# Patient Record
Sex: Male | Born: 1945 | Race: White | Hispanic: No | Marital: Married | Smoking: Never smoker
Health system: Southern US, Community
[De-identification: ages and names within clinical notes are randomized; demographics above are authoritative.]

## PROBLEM LIST (undated history)

## (undated) DIAGNOSIS — I499 Cardiac arrhythmia, unspecified: Secondary | ICD-10-CM

## (undated) DIAGNOSIS — D696 Thrombocytopenia, unspecified: Secondary | ICD-10-CM

## (undated) DIAGNOSIS — L729 Follicular cyst of the skin and subcutaneous tissue, unspecified: Secondary | ICD-10-CM

## (undated) DIAGNOSIS — I10 Essential (primary) hypertension: Secondary | ICD-10-CM

## (undated) DIAGNOSIS — M109 Gout, unspecified: Secondary | ICD-10-CM

## (undated) DIAGNOSIS — K8020 Calculus of gallbladder without cholecystitis without obstruction: Secondary | ICD-10-CM

## (undated) DIAGNOSIS — N400 Enlarged prostate without lower urinary tract symptoms: Secondary | ICD-10-CM

## (undated) DIAGNOSIS — I714 Abdominal aortic aneurysm, without rupture, unspecified: Secondary | ICD-10-CM

## (undated) DIAGNOSIS — Z8711 Personal history of peptic ulcer disease: Secondary | ICD-10-CM

## (undated) DIAGNOSIS — N281 Cyst of kidney, acquired: Secondary | ICD-10-CM

## (undated) DIAGNOSIS — Z9289 Personal history of other medical treatment: Secondary | ICD-10-CM

## (undated) DIAGNOSIS — C801 Malignant (primary) neoplasm, unspecified: Secondary | ICD-10-CM

## (undated) DIAGNOSIS — Z8614 Personal history of Methicillin resistant Staphylococcus aureus infection: Secondary | ICD-10-CM

## (undated) DIAGNOSIS — I429 Cardiomyopathy, unspecified: Secondary | ICD-10-CM

## (undated) DIAGNOSIS — N4340 Spermatocele of epididymis, unspecified: Secondary | ICD-10-CM

## (undated) DIAGNOSIS — F1010 Alcohol abuse, uncomplicated: Secondary | ICD-10-CM

## (undated) DIAGNOSIS — K5790 Diverticulosis of intestine, part unspecified, without perforation or abscess without bleeding: Secondary | ICD-10-CM

## (undated) DIAGNOSIS — Z950 Presence of cardiac pacemaker: Secondary | ICD-10-CM

## (undated) DIAGNOSIS — F329 Major depressive disorder, single episode, unspecified: Secondary | ICD-10-CM

## (undated) DIAGNOSIS — K760 Fatty (change of) liver, not elsewhere classified: Secondary | ICD-10-CM

## (undated) DIAGNOSIS — E663 Overweight: Secondary | ICD-10-CM

## (undated) DIAGNOSIS — I6529 Occlusion and stenosis of unspecified carotid artery: Secondary | ICD-10-CM

## (undated) DIAGNOSIS — K279 Peptic ulcer, site unspecified, unspecified as acute or chronic, without hemorrhage or perforation: Secondary | ICD-10-CM

## (undated) DIAGNOSIS — Z95810 Presence of automatic (implantable) cardiac defibrillator: Secondary | ICD-10-CM

## (undated) DIAGNOSIS — C4490 Unspecified malignant neoplasm of skin, unspecified: Secondary | ICD-10-CM

## (undated) DIAGNOSIS — M5430 Sciatica, unspecified side: Secondary | ICD-10-CM

## (undated) DIAGNOSIS — F32A Depression, unspecified: Secondary | ICD-10-CM

## (undated) DIAGNOSIS — K269 Duodenal ulcer, unspecified as acute or chronic, without hemorrhage or perforation: Secondary | ICD-10-CM

## (undated) DIAGNOSIS — R339 Retention of urine, unspecified: Secondary | ICD-10-CM

## (undated) DIAGNOSIS — K219 Gastro-esophageal reflux disease without esophagitis: Secondary | ICD-10-CM

## (undated) DIAGNOSIS — D494 Neoplasm of unspecified behavior of bladder: Secondary | ICD-10-CM

## (undated) DIAGNOSIS — I509 Heart failure, unspecified: Secondary | ICD-10-CM

## (undated) DIAGNOSIS — E785 Hyperlipidemia, unspecified: Secondary | ICD-10-CM

## (undated) DIAGNOSIS — N5089 Other specified disorders of the male genital organs: Secondary | ICD-10-CM

## (undated) DIAGNOSIS — E669 Obesity, unspecified: Secondary | ICD-10-CM

## (undated) DIAGNOSIS — G4730 Sleep apnea, unspecified: Secondary | ICD-10-CM

## (undated) DIAGNOSIS — I517 Cardiomegaly: Secondary | ICD-10-CM

## (undated) HISTORY — PX: HERNIA REPAIR: SHX51

## (undated) HISTORY — DX: Other specified disorders of the male genital organs: N50.89

## (undated) HISTORY — DX: Retention of urine, unspecified: R33.9

## (undated) HISTORY — PX: CATARACT EXTRACTION, BILATERAL: SHX1313

## (undated) HISTORY — PX: OTHER SURGICAL HISTORY: SHX169

## (undated) HISTORY — DX: Follicular cyst of the skin and subcutaneous tissue, unspecified: L72.9

## (undated) HISTORY — PX: INSERT / REPLACE / REMOVE PACEMAKER: SUR710

## (undated) HISTORY — DX: Diverticulosis of intestine, part unspecified, without perforation or abscess without bleeding: K57.90

## (undated) HISTORY — DX: Overweight: E66.3

## (undated) HISTORY — PX: FOOT NEUROMA SURGERY: SHX646

## (undated) HISTORY — DX: Personal history of peptic ulcer disease: Z87.11

## (undated) HISTORY — PX: VAGOTOMY: SUR1431

## (undated) HISTORY — DX: Spermatocele of epididymis, unspecified: N43.40

## (undated) HISTORY — PX: CARDIAC CATHETERIZATION: SHX172

## (undated) HISTORY — DX: Personal history of other medical treatment: Z92.89

## (undated) HISTORY — PX: EP IMPLANTABLE DEVICE: SHX172B

## (undated) HISTORY — DX: Neoplasm of unspecified behavior of bladder: D49.4

---

## 2004-07-03 ENCOUNTER — Ambulatory Visit: Payer: Self-pay | Admitting: Podiatry

## 2004-07-18 ENCOUNTER — Ambulatory Visit: Payer: Self-pay | Admitting: Podiatry

## 2004-07-24 ENCOUNTER — Inpatient Hospital Stay: Payer: Self-pay | Admitting: Podiatry

## 2004-12-03 ENCOUNTER — Emergency Department: Payer: Self-pay | Admitting: Emergency Medicine

## 2005-03-03 ENCOUNTER — Ambulatory Visit: Payer: Self-pay | Admitting: Gastroenterology

## 2005-10-29 ENCOUNTER — Ambulatory Visit: Payer: Self-pay | Admitting: Gastroenterology

## 2007-06-30 ENCOUNTER — Ambulatory Visit: Payer: Self-pay | Admitting: Internal Medicine

## 2008-05-12 ENCOUNTER — Inpatient Hospital Stay: Payer: Self-pay | Admitting: Surgery

## 2008-08-13 ENCOUNTER — Ambulatory Visit: Payer: Self-pay | Admitting: Unknown Physician Specialty

## 2009-01-16 ENCOUNTER — Ambulatory Visit: Payer: Self-pay | Admitting: Cardiology

## 2009-04-24 ENCOUNTER — Ambulatory Visit: Payer: Self-pay | Admitting: Orthopedic Surgery

## 2009-04-29 ENCOUNTER — Ambulatory Visit: Payer: Self-pay | Admitting: Orthopedic Surgery

## 2009-07-28 ENCOUNTER — Emergency Department: Payer: Self-pay | Admitting: Unknown Physician Specialty

## 2009-08-07 ENCOUNTER — Ambulatory Visit: Payer: Self-pay | Admitting: Cardiology

## 2009-08-09 ENCOUNTER — Ambulatory Visit: Payer: Self-pay | Admitting: Cardiology

## 2010-05-04 DIAGNOSIS — C801 Malignant (primary) neoplasm, unspecified: Secondary | ICD-10-CM

## 2010-05-04 DIAGNOSIS — C20 Malignant neoplasm of rectum: Secondary | ICD-10-CM

## 2010-05-04 HISTORY — DX: Malignant (primary) neoplasm, unspecified: C80.1

## 2010-05-04 HISTORY — DX: Malignant neoplasm of rectum: C20

## 2010-06-23 ENCOUNTER — Ambulatory Visit: Payer: Self-pay | Admitting: Unknown Physician Specialty

## 2010-07-01 ENCOUNTER — Ambulatory Visit: Payer: Self-pay | Admitting: Unknown Physician Specialty

## 2010-07-02 LAB — PATHOLOGY REPORT

## 2011-05-05 DIAGNOSIS — C4490 Unspecified malignant neoplasm of skin, unspecified: Secondary | ICD-10-CM

## 2011-05-05 HISTORY — DX: Unspecified malignant neoplasm of skin, unspecified: C44.90

## 2011-10-19 ENCOUNTER — Ambulatory Visit: Payer: Self-pay | Admitting: Unknown Physician Specialty

## 2011-10-21 LAB — PATHOLOGY REPORT

## 2012-03-09 ENCOUNTER — Ambulatory Visit: Payer: Self-pay | Admitting: Internal Medicine

## 2012-05-04 DIAGNOSIS — R55 Syncope and collapse: Secondary | ICD-10-CM

## 2012-05-04 HISTORY — DX: Syncope and collapse: R55

## 2012-05-04 HISTORY — PX: EYE SURGERY: SHX253

## 2012-06-28 ENCOUNTER — Ambulatory Visit: Payer: Self-pay | Admitting: Sports Medicine

## 2012-08-26 ENCOUNTER — Ambulatory Visit: Payer: Self-pay | Admitting: Physical Medicine and Rehabilitation

## 2012-08-26 LAB — APTT: Activated PTT: 28.9 secs (ref 23.6–35.9)

## 2012-08-26 LAB — PLATELET COUNT: Platelet: 126 10*3/uL — ABNORMAL LOW (ref 150–440)

## 2012-12-30 ENCOUNTER — Emergency Department: Payer: Self-pay | Admitting: Emergency Medicine

## 2012-12-30 LAB — COMPREHENSIVE METABOLIC PANEL
Albumin: 3.6 g/dL (ref 3.4–5.0)
BUN: 13 mg/dL (ref 7–18)
Calcium, Total: 8.9 mg/dL (ref 8.5–10.1)
Co2: 27 mmol/L (ref 21–32)
EGFR (African American): >60
EGFR (Non-African Amer.): >60
Potassium: 3.2 mmol/L — ABNORMAL LOW (ref 3.5–5.1)
SGOT(AST): 36 U/L (ref 15–37)
SGPT (ALT): 41 U/L (ref 12–78)
Sodium: 138 mmol/L (ref 136–145)

## 2012-12-30 LAB — CBC
HCT: 48.7 % (ref 40.0–52.0)
HGB: 16.9 g/dL (ref 13.0–18.0)
MCH: 30 pg (ref 26.0–34.0)
MCHC: 34.6 g/dL (ref 32.0–36.0)
MCV: 87 fL (ref 80–100)
RDW: 13.2 % (ref 11.5–14.5)
WBC: 8.2 10*3/uL (ref 3.8–10.6)

## 2012-12-30 LAB — CK TOTAL AND CKMB (NOT AT ARMC): CK, Total: 205 U/L (ref 35–232)

## 2013-03-04 ENCOUNTER — Observation Stay: Payer: Self-pay | Admitting: Internal Medicine

## 2013-03-04 LAB — PROTIME-INR: INR: 1

## 2013-03-04 LAB — CK TOTAL AND CKMB (NOT AT ARMC)
CK, Total: 65 U/L (ref 35–232)
CK-MB: 1.1 ng/mL (ref 0.5–3.6)

## 2013-03-04 LAB — COMPREHENSIVE METABOLIC PANEL
Albumin: 3.5 g/dL (ref 3.4–5.0)
Anion Gap: 9 (ref 7–16)
Calcium, Total: 9 mg/dL (ref 8.5–10.1)
Co2: 24 mmol/L (ref 21–32)
Creatinine: 0.84 mg/dL (ref 0.60–1.30)
EGFR (African American): >60
EGFR (Non-African Amer.): >60
Glucose: 152 mg/dL — ABNORMAL HIGH (ref 65–99)
Osmolality: 277 (ref 275–301)
Potassium: 3 mmol/L — ABNORMAL LOW (ref 3.5–5.1)
SGOT(AST): 31 U/L (ref 15–37)
Sodium: 138 mmol/L (ref 136–145)
Total Protein: 7.2 g/dL (ref 6.4–8.2)

## 2013-03-04 LAB — CBC
HCT: 48 % (ref 40.0–52.0)
MCV: 86 fL (ref 80–100)
Platelet: 124 10*3/uL — ABNORMAL LOW (ref 150–440)
RDW: 14.4 % (ref 11.5–14.5)

## 2013-03-04 LAB — LIPASE, BLOOD: Lipase: 86 U/L (ref 73–393)

## 2013-03-04 LAB — APTT: Activated PTT: 29 secs (ref 23.6–35.9)

## 2013-03-04 LAB — TROPONIN I: Troponin-I: <0.02 ng/mL

## 2013-03-11 ENCOUNTER — Emergency Department: Payer: Self-pay | Admitting: Emergency Medicine

## 2013-03-11 LAB — URINALYSIS, COMPLETE
Bacteria: NONE SEEN
Glucose,UR: NEGATIVE mg/dL (ref 0–75)
Ketone: NEGATIVE
Leukocyte Esterase: NEGATIVE
Nitrite: NEGATIVE
Ph: 6 (ref 4.5–8.0)
Protein: NEGATIVE
RBC,UR: NONE SEEN /HPF (ref 0–5)
Specific Gravity: 1.002 (ref 1.003–1.030)
Squamous Epithelial: <1
WBC UR: <1 /HPF (ref 0–5)

## 2013-07-07 ENCOUNTER — Ambulatory Visit: Payer: Self-pay | Admitting: Urology

## 2013-07-12 LAB — PATHOLOGY REPORT

## 2014-01-17 ENCOUNTER — Observation Stay: Payer: Self-pay | Admitting: Internal Medicine

## 2014-01-17 LAB — URINALYSIS, COMPLETE
BACTERIA: NONE SEEN
BILIRUBIN, UR: NEGATIVE
Blood: NEGATIVE
GLUCOSE, UR: NEGATIVE mg/dL (ref 0–75)
Hyaline Cast: 1
KETONE: NEGATIVE
Leukocyte Esterase: NEGATIVE
NITRITE: NEGATIVE
PH: 5 (ref 4.5–8.0)
PROTEIN: NEGATIVE
RBC,UR: <1 /HPF (ref 0–5)
Specific Gravity: 1.012 (ref 1.003–1.030)
Squamous Epithelial: 1

## 2014-01-17 LAB — CBC WITH DIFFERENTIAL/PLATELET
BASOS ABS: 0.1 10*3/uL (ref 0.0–0.1)
Basophil %: 1.1 %
Eosinophil #: 0.4 10*3/uL (ref 0.0–0.7)
Eosinophil %: 5.3 %
HCT: 46.1 % (ref 40.0–52.0)
HGB: 15.3 g/dL (ref 13.0–18.0)
LYMPHS ABS: 2 10*3/uL (ref 1.0–3.6)
Lymphocyte %: 26.7 %
MCH: 30.1 pg (ref 26.0–34.0)
MCHC: 33.1 g/dL (ref 32.0–36.0)
MCV: 91 fL (ref 80–100)
Monocyte #: 0.7 x10 3/mm (ref 0.2–1.0)
Monocyte %: 9.4 %
NEUTROS ABS: 4.2 10*3/uL (ref 1.4–6.5)
Neutrophil %: 57.5 %
Platelet: 115 10*3/uL — ABNORMAL LOW (ref 150–440)
RBC: 5.08 10*6/uL (ref 4.40–5.90)
RDW: 13.4 % (ref 11.5–14.5)
WBC: 7.4 10*3/uL (ref 3.8–10.6)

## 2014-01-17 LAB — COMPREHENSIVE METABOLIC PANEL
ALBUMIN: 3.3 g/dL — AB (ref 3.4–5.0)
ALT: 36 U/L
ANION GAP: 10 (ref 7–16)
AST: 23 U/L (ref 15–37)
Alkaline Phosphatase: 63 U/L
BILIRUBIN TOTAL: 0.5 mg/dL (ref 0.2–1.0)
BUN: 11 mg/dL (ref 7–18)
CREATININE: 0.94 mg/dL (ref 0.60–1.30)
Calcium, Total: 9.1 mg/dL (ref 8.5–10.1)
Chloride: 104 mmol/L (ref 98–107)
Co2: 25 mmol/L (ref 21–32)
GLUCOSE: 124 mg/dL — AB (ref 65–99)
Osmolality: 278 (ref 275–301)
POTASSIUM: 3.2 mmol/L — AB (ref 3.5–5.1)
SODIUM: 139 mmol/L (ref 136–145)
Total Protein: 6.7 g/dL (ref 6.4–8.2)

## 2014-01-22 DIAGNOSIS — M5116 Intervertebral disc disorders with radiculopathy, lumbar region: Secondary | ICD-10-CM | POA: Insufficient documentation

## 2014-01-24 DIAGNOSIS — M5136 Other intervertebral disc degeneration, lumbar region: Secondary | ICD-10-CM | POA: Insufficient documentation

## 2014-01-24 DIAGNOSIS — M51369 Other intervertebral disc degeneration, lumbar region without mention of lumbar back pain or lower extremity pain: Secondary | ICD-10-CM | POA: Insufficient documentation

## 2014-02-20 DIAGNOSIS — N400 Enlarged prostate without lower urinary tract symptoms: Secondary | ICD-10-CM | POA: Insufficient documentation

## 2014-02-22 ENCOUNTER — Ambulatory Visit: Payer: Self-pay

## 2014-02-28 DIAGNOSIS — Z95810 Presence of automatic (implantable) cardiac defibrillator: Secondary | ICD-10-CM | POA: Insufficient documentation

## 2014-02-28 DIAGNOSIS — Z9889 Other specified postprocedural states: Secondary | ICD-10-CM | POA: Insufficient documentation

## 2014-02-28 DIAGNOSIS — E785 Hyperlipidemia, unspecified: Secondary | ICD-10-CM | POA: Insufficient documentation

## 2014-02-28 DIAGNOSIS — G4730 Sleep apnea, unspecified: Secondary | ICD-10-CM | POA: Insufficient documentation

## 2014-02-28 DIAGNOSIS — I429 Cardiomyopathy, unspecified: Secondary | ICD-10-CM | POA: Insufficient documentation

## 2014-06-01 ENCOUNTER — Emergency Department: Payer: Self-pay | Admitting: Emergency Medicine

## 2014-06-01 LAB — BASIC METABOLIC PANEL
Anion Gap: 9 (ref 7–16)
BUN: 7 mg/dL (ref 7–18)
CALCIUM: 9 mg/dL (ref 8.5–10.1)
CHLORIDE: 105 mmol/L (ref 98–107)
Co2: 27 mmol/L (ref 21–32)
Creatinine: 0.87 mg/dL (ref 0.60–1.30)
EGFR (African American): >60
GLUCOSE: 111 mg/dL — AB (ref 65–99)
OSMOLALITY: 280 (ref 275–301)
POTASSIUM: 3.2 mmol/L — AB (ref 3.5–5.1)
SODIUM: 141 mmol/L (ref 136–145)

## 2014-06-01 LAB — URINALYSIS, COMPLETE
Bilirubin,UR: NEGATIVE
Glucose,UR: NEGATIVE mg/dL (ref 0–75)
Ketone: NEGATIVE
Leukocyte Esterase: NEGATIVE
Nitrite: NEGATIVE
Ph: 6 (ref 4.5–8.0)
Protein: NEGATIVE
RBC,UR: <1 /HPF (ref 0–5)
SPECIFIC GRAVITY: 1.003 (ref 1.003–1.030)
Squamous Epithelial: NONE SEEN
WBC UR: <1 /HPF (ref 0–5)

## 2014-06-03 LAB — URINE CULTURE

## 2014-06-23 ENCOUNTER — Emergency Department: Payer: Self-pay | Admitting: Emergency Medicine

## 2014-07-11 ENCOUNTER — Ambulatory Visit: Payer: Self-pay

## 2014-08-24 NOTE — Discharge Summary (Signed)
PATIENT NAME:  Martin Hardy, Martin Hardy MR#:  [id number] DATE OF BIRTH:  Jun 15, 1945  DATE OF ADMISSION:  03/04/2013 DATE OF DISCHARGE:  03/04/2013  REASON FOR ADMISSION: Chest pain.   HISTORY OF PRESENT ILLNESS: Please see the dictated history of present illness done by Dr. Margaretmary Eddy on 03/04/2013.   PAST MEDICAL HISTORY: 1.  History of atrial fibrillation.  2.  Dilated cardiomyopathy, status post defibrillator placement.  3.  Obstructive sleep apnea, on CPAP.  4.  History of peptic ulcer disease.  5.  History of alcohol abuse.  6.  Benign hypertension.  7.  Hyperlipidemia.  8.  Status post hernia repair surgery.   MEDICATIONS ON ADMISSION:  Please see admission note.   ALLERGIES: None.   SOCIAL HISTORY:  As per admission note.  FAMILY HISTORY:  As per admission note.    REVIEW OF SYSTEMS: As per admission note.   PHYSICAL EXAM: GENERAL: The patient was in no acute distress.  VITAL SIGNS: Stable and he was afebrile.  HEENT: Exam was unremarkable.  NECK: Supple without JVD.  LUNGS: Clear.  CARDIAC: Exam revealed a regular rate and rhythm. Normal S1, S2.  ABDOMEN: Soft and nontender.  EXTREMITIES: Without edema.  NEUROLOGIC EXAM: Grossly nonfocal.   HOSPITAL COURSE: The patient was admitted with chest pain for rule out MI. He was admitted to telemetry. His pain resolved. He had 2 sets of normal cardiac enzymes. He was seen in consultation by Dr. Clayborn Bigness of cardiology, who felt that the patient was safe for discharge for outpatient followup with Dr. Saralyn Pilar. The patient was insistent upon discharge within 24 hours as his father had recently died and he needs to make funeral arrangements. Again, the cardiologist felt that it was safe for him to leave after 2 sets of negative enzymes. He was pain-free at the time of discharge and was discharged home on 03/04/2013 in stable condition.   DISCHARGE DIAGNOSES: 1.  Angina.  2.  Atherosclerotic cardiovascular disease.  3.  History of atrial  fibrillation.  4.  Cardiomyopathy, status post defibrillator placement.  5.  Benign hypertension.  6.  Hyperlipidemia.   DISCHARGE MEDICATIONS: 1.  Effexor-XR 75 mg p.o. daily.  2.  Lyrica as before.  3.  Lisinopril 40 mg p.o. daily.  4.  Flomax 0.4 mg p.o. daily.  5.  Nitrostat 0.4 mg sublingually p.r.n. chest pain.  6.  Zofran 4 mg p.o. every 6 hours p.r.n. nausea and vomiting.  7.  Zocor 20 mg p.o. at bedtime.  8.  Aspirin 325 mg p.o. daily.  9.  Coreg 25 mg p.o. b.i.d.  10.  Hydrochlorothiazide 25 mg p.o. daily.  11.  Mag-Ox 400 mg p.o. daily.  12.  Omeprazole 20 mg p.o. daily.   FOLLOWUP PLANS AND APPOINTMENTS: The patient was discharged on a low-sodium, low-fat, low-cholesterol diet. He will follow up with Dr. Saralyn Pilar within 1 week's time, sooner if needed.   ____________________________ Leonie Douglas Doy Hutching, MD jds:cs D: 03/05/2013 10:52:43 ET T: 03/05/2013 18:32:18 ET JOB#: [id number]  cc: Leonie Douglas. Doy Hutching, MD, <Dictator> JEFFREY Lennice Sites MD ELECTRONICALLY SIGNED 03/06/2013 7:32

## 2014-08-24 NOTE — H&P (Signed)
PATIENT NAME:  Martin Hardy, Martin Hardy MR#:  [id number] DATE OF BIRTH:  [birth year]  DATE OF ADMISSION:  03/04/2013  PRIMARY CARE PHYSICIAN:  Dr. Ola Spurr.   REFERRING PHYSICIAN:  Dr. Jacqualine Code  CHIEF COMPLAINT: Chest pain.   HISTORY OF PRESENT ILLNESS: The patient is a 69 year old male with past medical history of hypertension, hyperlipidemia, peptic ulcer disease, history of alcohol abuse, obstructive sleep apnea, lives on CPAP at bedtime and a history of paroxysmal atrial fibrillation, status post AICD, who is presenting to the ER with a chief complaint of midsternal chest pressure. The patient is reporting that he slept and in the middle of the night he woke up with chest pain. Denies any shortness of breath or radiation of the chest pain.  No nausea or vomiting. As the patient could not fall asleep with the chest pressure, he was brought into the ER by his wife. In the ER, the patient was given aspirin and sublingual nitroglycerin following which his pain eased off. He is describing that this is with no radiation. Denies any shortness of breath, diaphoresis, nausea, vomiting. No similar complaints in the past. The patient was seen by Dr. Saralyn Pilar a few years ago and had stress test done which was normal as reported by the patient. The patient follows up with Dr. Saralyn Pilar as an outpatient regarding his atrial fibrillation. The patient is stressed out and feeling sad as his dad just passed away last 2022/08/30. The patient's EKG has revealed chronic T wave inversions in leads V3 to V6. During my examination, the patient's chest pain is 2 out of 10 which trended down from 7 out of 10. The patient is resting comfortably and wife is at bedside.   PAST MEDICAL HISTORY: History of atrial fibrillation, status post AICD, hypertension, hyperlipidemia, obstructive sleep apnea uses CPAP at bedtime, peptic ulcer disease, alcohol abuse.   PAST SURGICAL HISTORY: The patient has had stress test, which was normal. Hernia  repair surgery. AICD placement.   ALLERGIES: None  PSYCHOSOCIAL HISTORY: Lives at home with wife. Denies any history of smoking, alcohol, or illicit drug usage.   FAMILY HISTORY: Dad had history of diabetes mellitus. Mother had history of COPD.   MEDICATIONS:  Prilosec 20 mg once daily, lisinopril 40 mg once daily,  Flomax 0.4 mg once a day, Coreg 12.5 mg 2 tablets p.o. 2 times a day.   REVIEW OF SYSTEMS: CONSTITUTIONAL: Denies any fever or fatigue.  EYES: Denies blurry vision or double vision.  EARS, NOSE, THROAT: No epistaxis or discharge.  RESPIRATORY: Denies cough, COPD. Has obstructive sleep apnea. CARDIOVASCULAR: Complaining of midsternal chest pressure which is significantly improved with aspirin and nitroglycerin.  GASTROINTESTINAL: Denies nausea, vomiting, diarrhea, abdominal pain.  GENITOURINARY: No dysuria or hematuria. Denies any hernias.  ENDOCRINE: Denies polyuria, nocturia, thyroid problems. HEMATOLOGY:  Denies anemia, easy bruising or bleeding.  INTEGUMENTARY: No acnes, rash or lesions.  MUSCULOSKELETAL:  No joint pain in the neck or swelling.   NEUROLOGIC:  Denies any history of vertigo, ataxia or stroke in the past.  PSYCHIATRIC: No ADD, OCD.   PHYSICAL EXAMINATION: VITAL SIGNS: Temperature 98 degrees Fahrenheit, pulse 50, respirations 18, blood pressure 95/53, pulse ox 93% on 2 liters.  GENERAL APPEARANCE: Not in acute distress. Moderately built and nourished.  HEENT: Normocephalic, atraumatic. Pupils are equally reacting to light and accommodation. No scleral icterus. No conjunctival injection. No sinus tenderness. No postnasal drip. No angioedema.  NECK: Supple. No JVD. No thyromegaly. No lymphadenopathy. Range of motion is intact.  LUNGS: Clear to auscultation bilaterally. No accessory muscle usage.  Anterior chest wall tenderness.  CARDIAC: S1, S2 normal. Regular rate and rhythm. No murmurs.  GASTROINTESTINAL: Soft. Bowel sounds are positive in all 4 quadrants.  Nontender, nondistended. No hepatosplenomegaly. No masses felt.  NEUROLOGIC:  Awake and oriented x 3. Motor and sensory are grossly intact. Reflexes are 2+.  EXTREMITIES: No edema. No cyanosis. No clubbing.  PSYCHIATRIC: Normal mood and affect.  MUSCULOSKELETAL: No joint effusion, tenderness, erythema.  VASCULAR: Peripheral pulses are 2+.  LABORATORY AND IMAGING STUDIES: Chest x-ray, no acute findings. EKG: Normal sinus rhythm. Incomplete right bundle branch block.  Current T wave inversions in leads V3 through V6. Cardiac enzymes are negative. CBC normal except platelet count is at 124. PT and INR are normal. LFTs are normal. BMP: Potassium is 3.0, glucose 152. The rest of the labs are normal.   ASSESSMENT AND PLAN: A 69 year old Caucasian male brought into the ER with midsternal chest pressure will be admitted with the following assessment and plan.  1.  Chest pain, rule out acute coronary syndrome, probably from stress from bereavement reaction.  Will admit him to telemetry. Cycle cardiac biomarkers. We will implement acute coronary syndrome protocol with oxygen, nitroglycerin, aspirin, beta blocker and statin. Will monitor him on telemetry.  2.  Sinus bradycardia. The patient is symptomatic. Will monitor him closely and the Coreg dose is reduced to half and if necessary, we will hold the medication if the patient persistently has bradycardia.  3.  Obstructive sleep apnea. Continue CPAP at bedtime.  4.  Hypertension. Blood pressure is stable.  5.  Hyperlipidemia. Continue statin.  6.  Peptic ulcer disease. Continue proton pump inhibitor.  7.  History of atrial fibrillation status post automatic implantable cardioverter-defibrillator  currently not on any anticoagulation.  8.  We will provide him gastrointestinal prophylaxis and deep vein thrombosis prophylaxis.  He is FULL CODE. Wife is the medical power of attorney. Will transfer the patient to Dr. Ola Spurr from [facility] group in a.m.    Total time spent on admission is 45 minutes.  Diagnosis and plan of care was discussed in detail with the patient.    ____________________________ Nicholes Mango, MD ag:dp D: 03/04/2013 06:16:17 ET T: 03/04/2013 06:45:08 ET JOB#: [id number]  cc: Nicholes Mango, MD, <Dictator> Nicholes Mango MD ELECTRONICALLY SIGNED 03/19/2013 8:05

## 2014-08-24 NOTE — Consult Note (Signed)
PATIENT NAME:  Martin Hardy, DESTIN MR#:  [id number] DATE OF BIRTH:  1946/03/01  DATE OF CONSULTATION:  03/04/2013  REFERRING PHYSICIAN:  Nicholes Mango, MD CONSULTING PHYSICIAN:  Dwayne D. Clayborn Bigness, MD  A patient of Dr. Ola Spurr. The patient sees Dr. Saralyn Pilar.   INDICATION: Chest pain.   HISTORY OF PRESENT ILLNESS: The patient is a 69 year old white male with history of hypertension, hyperlipidemia, peptic ulcer disease, alcohol abuse, obstructive sleep apnea, CPAP at bedtime, history of paroxysmal atrial fibrillation, status post AICD for cardiomyopathy, who presented to the ER with midsternal chest pain. Reported that he slept and in the middle of the night woke up with chest pain. Denies any shortness of breath. No radiation of pain. No nausea or vomiting. Had trouble falling asleep. Came to the Emergency Room with his wife, given aspirin and sublingual nitroglycerin and got better. No radiation of the pain is mentioned. No similar complaints in the past. He saw Dr. Saralyn Pilar in the past and had a stress test done. He sees Dr. Saralyn Pilar as an outpatient for his atrial fibrillation. 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 EKG had nonspecific findings. He presented for evaluation with chest pain, 7 out of 10.   PAST MEDICAL HISTORY: Atrial fibrillation, AICD, cardiomyopathy, hypertension, hyperlipidemia, obstructive sleep apnea, peptic ulcer disease, alcohol abuse.   PAST SURGICAL HISTORY: Hernia repair, AICD placement.   ALLERGIES: He states none.    SOCIAL HISTORY: Lives with his wife. Denies smoking. Has had a history of alcohol abuse in the past.   FAMILY HISTORY: Diabetes, COPD.  MEDICATIONS:  1. Prilosec 20 daily.  2. Lisinopril 40 a day. 3. Flomax 0.4 a day.  4. Coreg 12.5 twice a day.  5. Aspirin 81 mg a day.   REVIEW OF SYSTEMS: Denies blackout spells or syncope. No nausea or vomiting. No fever, no chills, no sweats. No weight  loss, no weight gain. No hemoptysis, hematemesis or bright red blood per rectum. Complained of chest pain at rest without radiation. He has been under a lot of stress recently.   PHYSICAL EXAMINATION:  VITAL SIGNS: Blood pressure 100/60, pulse of 50, respiratory rate of 18, afebrile.  HEENT: Normocephalic, atraumatic. Pupils equal and reactive to light.  NECK: Supple. No significant JVD, bruits or adenopathy.  LUNGS: Clear to auscultation and percussion. No significant wheeze, rhonchi or rale.  HEART: Regular rate and rhythm, S3, soft S4. PMI displaced laterally. Systolic ejection murmur at the apex.  ABDOMEN: Benign.  EXTREMITIES: Within normal limits.  NEUROLOGIC: Intact.  SKIN: Normal.   LABORATORIES: Chest x-ray negative. EKG: Incomplete right bundle branch block, nonspecific ST-T wave changes. CBC normal, platelet count of 124. INR normal. LFTs normal. BMP was normal. Potassium was 3.0, glucose of 152.   ASSESSMENT: Chest pain, possible angina, bradycardia, obstructive sleep apnea, hypertension, hyperlipidemia, peptic ulcer disease, history of atrial fibrillation, hypokalemia, history of alcohol abuse, cardiomyopathy.   PLAN:  1. Agree with admit. Rule out for myocardial infarction. Will continue further evaluation. Continue to follow up EKGs and cardiac enzymes and telemetry. If the patient rules out, consider functional study versus trial of medical therapy.  2. For bradycardia, be sure that it is not symptomatic. Continue Coreg. May reduce the dose slightly to improve bradycardia. No evidence of high-grade block.  3. For obstructive sleep apnea, continue CPAP at night. 4. For blood pressure, continue blood pressure medications. Again, we may cut back on the Coreg because  of bradycardia.  5. For hyperlipidemia, continue statin.  6. For peptic ulcer disease, continue proton pump inhibitor. 7. Make sure the patient refrains from alcohol abuse. 8. Will follow up cardiomyopathy, and AICD  has not discharged and appears to be no significant arrhythmia.  9. Will continue to follow the patient. Treat the patient conservatively for now.   ____________________________ Loran Senters. Clayborn Bigness, MD ddc:lb D: 03/07/2013 11:19:38 ET T: 03/07/2013 11:38:24 ET JOB#: [id number]  cc: Dwayne D. Clayborn Bigness, MD, <Dictator> Yolonda Kida MD ELECTRONICALLY SIGNED 04/04/2013 21:16

## 2014-08-25 NOTE — Op Note (Signed)
PATIENT NAME:  Martin Hardy, Martin Hardy MR#:  [id number] DATE OF BIRTH:  27-May-1945  DATE OF PROCEDURE:  07/07/2013  PREOPERATIVE DIAGNOSIS:  Possible bladder tumor.   POSTOPERATIVE DIAGNOSIS:  Possible bladder tumor.  PROCEDURE: Cysto bladder tumor.   BIOPSY:  Just distal to the left ureteral orifice, with fulguration.   SURGEON: Rick Duff, DO  ANESTHESIA: General.   COMPLICATIONS: None.   BLOOD LOSS: Zero.   DESCRIPTION OF PROCEDURE: With the patient sterilely prepped and draped in supine lithotomy position for ease of approach to the external genitalia, I viewed the urethra and bladder. The prostatic urethra shows a good-sized middle lobe hypertrophy of the prostate, which is probably partially obstructed. There is trabeculation of the bladder. No sacculi diverticula are seen. There is a suspicious area just distal to the left ureteral orifice, which I biopsied with a Lowsley forceps, then fulgurate with a Bugbee electrode. Water was used for irrigation. Bladder was then emptied, 30 mL of 0.5% Marcaine placed in the bladder, B and O suppository in the rectum. Rectal exam reveals a prostate that is small rectally, but it is more internally enlarged in the middle lobe. The patient is sent to recovery in satisfactory condition. Will be discharged and seen in 2 weeks. Appropriate timeout occurred before the procedure began.    ____________________________ Janice Coffin. [name], DO rdh:mr D: 07/07/2013 14:26:59 ET T: 07/07/2013 22:05:20 ET JOB#: [id number]  cc: Janice Coffin. [name], DO, <Dictator> Freddi Forster D Clarivel Callaway DO ELECTRONICALLY SIGNED 07/20/2013 17:33

## 2014-08-25 NOTE — Discharge Summary (Signed)
PATIENT NAME:  Martin Hardy, Martin Hardy MR#:  [id number] DATE OF BIRTH:  12-Apr-1946  DATE OF ADMISSION:  01/17/2014 DATE OF DISCHARGE:  01/17/2014  ADMITTING DIAGNOSIS: Left leg weakness.   DISCHARGE DIAGNOSES: 1.  Left leg weakness, likely secondary to severe lumbar disk disease. His symptoms have now improved. No evidence of acute cerebrovascular accident based on his CT scan. His carotid Dopplers are without any significant stenosis.  2.  Chronic atrial fibrillation. The patient needs to be considered for anticoagulation. He reports that he has discussed this with his primary cardiologist who stated because of his gastrointestinal issues would not recommend aspirin either. I recommend that this needs to be re-evaluated and possible long-term anticoagulation needs to be considered.  3.  Hypokalemia, status post replacement.  4.  Gastroesophageal reflux disease.  5.  Lumbar spondylosis with moderate to severe left L4-L5 neural foraminal narrowing.  6.  Obstructive sleep apnea, on CPAP.  7.  Hypertension.  8.  Hyperlipidemia.   CONSULTANTS: None.   PERTINENT DIAGNOSTIC DATA: CT of the lumbar spine showed lumbar spondylosis with suspected moderate to severe left L4-L5 neural foraminal narrowing.   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   Ultrasound carotid Dopplers showed less than 50% stenosis in the right and the left internal carotid arteries. Irregular cardiac rhythm suggestive of A-fib.   Glucose 124, BUN 11, creatinine 0.94, sodium 139, potassium 3.2, chloride 104, CO2 25. Calcium 9.1. LFTs were normal, except albumin at 3.3. WBC 7.4, hemoglobin 15.3, platelet count 115,000.   HOSPITAL COURSE: Please refer to H and P done by the admitting physician. The patient is a 69 year old white male with history of chronic back problems, A-fib, not on anticoagulation, who has chronic issues with back pain who presented with left leg weakness. He presented with 1 to 2  day's duration of left leg weakness and associated with 1 day of the left leg paresthesia.  There was concern that maybe he had CVA-like symptoms. In the Emergency Room, therefore, he was admitted. His CT did show old caudate nucleus stroke, but no other abnormality was noted. The patient's symptoms rapidly improved. The patient does have a history of A-fib and it is recommended that he at least be placed on aspirin. The patient reported that he has been told by his primary cardiologist that with his GI issues he does not need aspirin. I recommend him to take aspirin and possible anticoagulation with a blood thinner for long-term prophylaxis for CVA and he is to discuss this further with his cardiologist. At this time, he is doing much better and is stable for discharge.   DISCHARGE MEDICATIONS: Lisinopril 40 daily, Flomax 0.4 at bedtime, carvedilol 25 mg 1 tab p.o. b.i.d., omeprazole 20 daily, Lyrica 150 mg 1 tab p.o. b.i.d. as needed, Percocet 5/325 q. 6 p.r.n., aspirin 325 p.o. daily.   DIET: Low-sodium.   ACTIVITY: As tolerated.   DISCHARGE FOLLOWUP: Follow up with Dr. Ola Spurr in 1 to 2 weeks. Follow with [facility] in 1 to 2 weeks. The patient also needs to follow up with his primary cardiologist to further discuss anticoagulation.  TIME SPENT: 45 minutes.   ____________________________ Lafonda Mosses Posey Pronto, MD shp:sb D: 01/18/2014 09:14:35 ET T: 01/18/2014 11:28:33 ET JOB#: [id number]  cc: Destani Wamser H. Posey Pronto, MD, <Dictator> Alric Seton MD ELECTRONICALLY SIGNED 01/19/2014 8:42

## 2014-08-25 NOTE — H&P (Signed)
PATIENT NAME:  Martin Hardy, FALLEN MR#:  [id number] DATE OF BIRTH:  06-Sep-1945  DATE OF ADMISSION:  01/17/2014  REFERRING PHYSICIAN: Dr.  Owens Shark.   PRIMARY CARE PHYSICIAN: Dr. Ola Spurr.   CHIEF COMPLAINT: Left leg weakness.   HISTORY OF PRESENT ILLNESS: A 69 year old Caucasian gentleman with history of atrial fibrillation not on anticoagulation, as well as chronic lumbago as well as sciatic pain involving the left leg presented with left leg weakness. He describes chronic back pain, which has been unchanged; however, now has had 1 to 2 day duration of the left leg weakness with associated 1 day of left leg paresthesias. Currently paresthesias is completely resolved. Emergency Department staff concerned for CVA-like symptoms, thus requesting admission. Currently complaining only of weakness as above, however, somewhat improved.   REVIEW OF SYSTEMS:  CONSTITUTIONAL: Denies fever, positive for weakness as described above.  EYES: No blurred vision, double vision. Denies any eye pain.   EARS, NOSE AND THROAT: Denies any tinnitus, ear pain, hearing loss.   RESPIRATORY: Denies cough, wheeze, shortness of breath.  CARDIOVASCULAR: Denies chest pain palpitations, edema.  GASTROINTESTINAL: Denies nausea, vomiting, diarrhea and abdominal pain.  GENITOURINARY: Denies dysuria, hematuria.  ENDOCRINE: Denies nocturia or thyroid problems.   HEMATOLOGIC/LYMPHATIC: Denies easy bruising or bleeding.   SKIN: Denies rash or lesion.  MUSCULOSKELETAL: Positive for chronic lumbar back pain. Denies neck, shoulders, knees, hip pain or any arthritic symptoms.   NEUROLOGIC: Full weakness and paresthesias of the left leg as described above. Denies any current numbness.  PSYCHIATRIC: Denies anxiety or depressive symptoms.   Otherwise full review of systems as reviewed by me is negative.      PAST MEDICAL HISTORY: Atrial fibrillation, hypertension, hyperlipidemia, obstructive sleep apnea requiring CPAP.   SOCIAL HISTORY:  Denies any tobacco use. Positive for occasional alcohol use.   FAMILY HISTORY: Positive for diabetes.  ALLERGIES:  NSAIDS.   HOME MEDICATIONS: 1. Percocet 5/325 mg 1 tablet p.o. q. 6 hours. 2. Lisinopril 40 mg p.o. q. daily. 3. Flomax 0.4 mg p.o. q. daily. 4.  Lyrica 150 mg p.o. b.i.d. 5. Coreg 25 mg p.o. b.i.d.  6. Prilosec 20 mg p.o. q. daily.   PHYSICAL EXAMINATION:  VITAL SIGNS: Temperature 98.2, heart rate 57, respirations 20, blood pressure 126/69, saturating 94% on room air. Weight 102.1 kg, BMI of 33.3.  GENERAL: Well-nourished, well-developed Caucasian male in no acute distress.  HEAD: Normocephalic, atraumatic.  EYES: Pupils equal, round reactive to light. Extraocular movements intact. No scleral icterus.   MOUTH: Moist mucosal membranes. Dentition is intact.  EARS, NOSE AND THROAT: Clear without lesions or exudates.   NECK: Supple. No thyromegaly. No nodules. No JVD.  PULMONARY: Clear to auscultation bilaterally without wheezes, rubs, or rhonchi. No accessory muscle use. Good respiratory effort.  CHEST: Nontender to palpation.  CARDIOVASCULAR: S1, S2, irregular rate and irregular rhythm. No murmurs, rubs, or gallops. No edema. Pedal pulses 2+ bilaterally.  GASTROINTESTINAL: Soft, nontender, nondistended. No masses. Positive bowel sounds. No hepatosplenomegaly.  MUSCULOSKELETAL: No swelling, clubbing, or edema. Range of motion full in all extremities.  NEUROLOGIC: Cranial nerves II through XII are intact. Strength bilaterally 5/5 in all extremities including proximal and distal flexions and extension, however, there is mild weakness on the left great toe extension consistent with L5 weakness.  Sensation intact. Pronator drift within normal limits.   SKIN: No ulceration, lesions, rash, cyanosis. Skin warm, dry. Turgor intact. PSYCHIATRIC: Mood and affect within normal limits. The patient is alert and oriented x 3 Insight and  judgment intact.   LABORATORY DATA: CT head  performed reveals no evidence for acute intracranial process. Remainder of laboratory data: Sodium 139, potassium 3.2, chloride 104, bicarbonate 25, BUN 11, creatinine 0.94, glucose 124, albumin 33.3. WBC 7.4, hemoglobin 15.3, platelets of 115.   ASSESSMENT AND PLAN: A 69 year old gentleman with history of atrial fibrillation, chronic lower back pain presenting with left leg weakness and paresthesias.  1. Left leg weakness. There is minimal L5, weakness on examination 4-/5. The ER staff concerned for cerebrovascular accident, however, I feel that this is most likely spinal in etiology and will get lumbar CT as unable to get MRI. Will also get physical therapy evaluation.  2. Hypokalemia. Replace to goal. 3. Gastroesophageal reflux disease. PPI therapy.  4. Atrial fibrillation, rate controlled. No anticoagulation.  5. Venous thromboembolism prophylaxis with heparin subcutaneous.   CODE STATUS: The patient is a full code.   TIME SPENT: 45 minutes    ____________________________ Aaron Mose. Hower, MD dkh:JT D: 01/17/2014 02:24:41 ET T: 01/17/2014 04:37:37 ET JOB#: [id number]  cc: Aaron Mose. Hower, MD, <Dictator> DAVID Woodfin Ganja MD ELECTRONICALLY SIGNED 01/24/2014 20:30

## 2014-09-13 ENCOUNTER — Encounter
Admission: RE | Admit: 2014-09-13 | Discharge: 2014-09-13 | Disposition: A | Discharge status: Home / Self Care | Payer: Medicare Other | Source: Ambulatory Visit | Attending: Urology | Admitting: Urology

## 2014-09-13 DIAGNOSIS — R301 Vesical tenesmus: Secondary | ICD-10-CM | POA: Diagnosis not present

## 2014-09-13 DIAGNOSIS — G4730 Sleep apnea, unspecified: Secondary | ICD-10-CM | POA: Insufficient documentation

## 2014-09-13 DIAGNOSIS — E669 Obesity, unspecified: Secondary | ICD-10-CM | POA: Diagnosis not present

## 2014-09-13 DIAGNOSIS — I10 Essential (primary) hypertension: Secondary | ICD-10-CM | POA: Diagnosis not present

## 2014-09-13 DIAGNOSIS — N400 Enlarged prostate without lower urinary tract symptoms: Secondary | ICD-10-CM | POA: Diagnosis not present

## 2014-09-13 DIAGNOSIS — Z95810 Presence of automatic (implantable) cardiac defibrillator: Secondary | ICD-10-CM | POA: Insufficient documentation

## 2014-09-13 DIAGNOSIS — Z01812 Encounter for preprocedural laboratory examination: Secondary | ICD-10-CM | POA: Diagnosis present

## 2014-09-13 DIAGNOSIS — R339 Retention of urine, unspecified: Secondary | ICD-10-CM | POA: Insufficient documentation

## 2014-09-13 DIAGNOSIS — F329 Major depressive disorder, single episode, unspecified: Secondary | ICD-10-CM | POA: Diagnosis not present

## 2014-09-13 HISTORY — DX: Sleep apnea, unspecified: G47.30

## 2014-09-13 HISTORY — DX: Malignant (primary) neoplasm, unspecified: C80.1

## 2014-09-13 HISTORY — DX: Benign prostatic hyperplasia without lower urinary tract symptoms: N40.0

## 2014-09-13 HISTORY — DX: Essential (primary) hypertension: I10

## 2014-09-13 HISTORY — DX: Obesity, unspecified: E66.9

## 2014-09-13 HISTORY — DX: Cardiomyopathy, unspecified: I42.9

## 2014-09-13 HISTORY — DX: Cardiomegaly: I51.7

## 2014-09-13 HISTORY — DX: Heart failure, unspecified: I50.9

## 2014-09-13 HISTORY — DX: Depression, unspecified: F32.A

## 2014-09-13 HISTORY — DX: Cardiac arrhythmia, unspecified: I49.9

## 2014-09-13 HISTORY — DX: Alcohol abuse, uncomplicated: F10.10

## 2014-09-13 HISTORY — DX: Peptic ulcer, site unspecified, unspecified as acute or chronic, without hemorrhage or perforation: K27.9

## 2014-09-13 HISTORY — DX: Presence of cardiac pacemaker: Z95.0

## 2014-09-13 HISTORY — DX: Presence of automatic (implantable) cardiac defibrillator: Z95.810

## 2014-09-13 HISTORY — DX: Major depressive disorder, single episode, unspecified: F32.9

## 2014-09-13 LAB — BASIC METABOLIC PANEL
Anion gap: 6 (ref 5–15)
BUN: 17 mg/dL (ref 6–20)
CO2: 31 mmol/L (ref 22–32)
CREATININE: 0.87 mg/dL (ref 0.61–1.24)
Calcium: 9.1 mg/dL (ref 8.9–10.3)
Chloride: 102 mmol/L (ref 101–111)
GFR calc non Af Amer: >60 mL/min (ref >60)
Glucose, Bld: 97 mg/dL (ref 65–99)
Potassium: 3.7 mmol/L (ref 3.5–5.1)
Sodium: 139 mmol/L (ref 135–145)

## 2014-09-13 LAB — DIFFERENTIAL
BASOS PCT: 0 %
Basophils Absolute: 0 10*3/uL (ref 0–0.1)
Eosinophils Absolute: 0.2 10*3/uL (ref 0–0.7)
Eosinophils Relative: 1 %
LYMPHS PCT: 25 %
Lymphs Abs: 2.7 10*3/uL (ref 1.0–3.6)
Monocytes Absolute: 1 10*3/uL (ref 0.2–1.0)
Monocytes Relative: 9 %
NEUTROS ABS: 7 10*3/uL — AB (ref 1.4–6.5)
Neutrophils Relative %: 65 %

## 2014-09-13 LAB — CBC
HEMATOCRIT: 47.6 % (ref 40.0–52.0)
Hemoglobin: 15.9 g/dL (ref 13.0–18.0)
MCH: 30.1 pg (ref 26.0–34.0)
MCHC: 33.4 g/dL (ref 32.0–36.0)
MCV: 90.1 fL (ref 80.0–100.0)
Platelets: 112 10*3/uL — ABNORMAL LOW (ref 150–440)
RBC: 5.29 MIL/uL (ref 4.40–5.90)
RDW: 13.8 % (ref 11.5–14.5)
WBC: 10.8 10*3/uL — ABNORMAL HIGH (ref 3.8–10.6)

## 2014-09-13 NOTE — Patient Instructions (Signed)
  Your procedure is scheduled on:09/24/14 Report to Day Surgery. To find out your arrival time please call [phone] between 1PM - 3PM on 09/21/14.  Remember: Instructions that are not followed completely may result in serious medical risk, up to and including death, or upon the discretion of your surgeon and anesthesiologist your surgery may need to be rescheduled.    __x__ 1. Do not eat food or drink liquids after midnight. No gum chewing or hard candies.     ___x_ 2. No Alcohol for 24 hours before or after surgery.   ____ 3. Bring all medications with you on the day of surgery if instructed.    _x___ 4. Notify your doctor if there is any change in your medical condition     (cold, fever, infections).     Do not wear jewelry, make-up, hairpins, clips or nail polish.  Do not wear lotions, powders, or perfumes. You may wear deodorant.  Do not shave 48 hours prior to surgery. Men may shave face and neck.  Do not bring valuables to the hospital.    [facility] is not responsible for any belongings or valuables.               Contacts, dentures or bridgework may not be worn into surgery.  Leave your suitcase in the car. After surgery it may be brought to your room.  For patients admitted to the hospital, discharge time is determined by your                treatment team.   Patients discharged the day of surgery will not be allowed to drive home.   Please read over the following fact sheets that you were given:   ____ Take these medicines the morning of surgery with A SIP OF WATER:    1.carvedilol  2. lyrica   3. prilosec  4.  5.  6.  ____ Fleet Enema (as directed)   ____ Use CHG Soap as directed  ____ Use inhalers on the day of surgery  ____ Stop metformin 2 days prior to surgery    ____ Take 1/2 of usual insulin dose the night before surgery and none on the morning of surgery.   ____ Stop Coumadin/Plavix/aspirin on  ____ Stop Anti-inflammatories on   ____ Stop  supplements until after surgery.    __x__ Bring C-Pap to the hospital.

## 2014-09-13 NOTE — OR Nursing (Signed)
Has medtronic pacemaker/icd

## 2014-09-13 NOTE — OR Nursing (Signed)
CLEARED BY DR PARACHOS LOW RISK

## 2014-09-17 NOTE — OR Nursing (Signed)
CLEARED BY PARACHOS LOW RISK 4/16

## 2014-09-24 ENCOUNTER — Encounter
Admission: RE | Disposition: A | Discharge status: Home / Self Care | Payer: Self-pay | Source: Ambulatory Visit | Attending: Urology

## 2014-09-24 ENCOUNTER — Encounter: Payer: Self-pay | Admitting: Anesthesiology

## 2014-09-24 ENCOUNTER — Ambulatory Visit: Payer: Medicare Other | Admitting: Anesthesiology

## 2014-09-24 ENCOUNTER — Ambulatory Visit
Admission: RE | Admit: 2014-09-24 | Discharge: 2014-09-24 | Disposition: A | Discharge status: Home / Self Care | Payer: Medicare Other | Source: Ambulatory Visit | Attending: Urology | Admitting: Urology

## 2014-09-24 DIAGNOSIS — I10 Essential (primary) hypertension: Secondary | ICD-10-CM | POA: Diagnosis not present

## 2014-09-24 DIAGNOSIS — N400 Enlarged prostate without lower urinary tract symptoms: Secondary | ICD-10-CM | POA: Insufficient documentation

## 2014-09-24 DIAGNOSIS — D3A026 Benign carcinoid tumor of the rectum: Secondary | ICD-10-CM | POA: Diagnosis not present

## 2014-09-24 DIAGNOSIS — F1020 Alcohol dependence, uncomplicated: Secondary | ICD-10-CM | POA: Diagnosis not present

## 2014-09-24 DIAGNOSIS — F329 Major depressive disorder, single episode, unspecified: Secondary | ICD-10-CM | POA: Insufficient documentation

## 2014-09-24 DIAGNOSIS — E669 Obesity, unspecified: Secondary | ICD-10-CM | POA: Diagnosis not present

## 2014-09-24 DIAGNOSIS — G4730 Sleep apnea, unspecified: Secondary | ICD-10-CM | POA: Diagnosis not present

## 2014-09-24 DIAGNOSIS — Z79899 Other long term (current) drug therapy: Secondary | ICD-10-CM | POA: Diagnosis not present

## 2014-09-24 DIAGNOSIS — N4340 Spermatocele of epididymis, unspecified: Secondary | ICD-10-CM | POA: Insufficient documentation

## 2014-09-24 DIAGNOSIS — Z9289 Personal history of other medical treatment: Secondary | ICD-10-CM | POA: Diagnosis not present

## 2014-09-24 DIAGNOSIS — Z95810 Presence of automatic (implantable) cardiac defibrillator: Secondary | ICD-10-CM | POA: Insufficient documentation

## 2014-09-24 DIAGNOSIS — N320 Bladder-neck obstruction: Secondary | ICD-10-CM | POA: Diagnosis present

## 2014-09-24 DIAGNOSIS — Z91048 Other nonmedicinal substance allergy status: Secondary | ICD-10-CM | POA: Diagnosis not present

## 2014-09-24 DIAGNOSIS — K5790 Diverticulosis of intestine, part unspecified, without perforation or abscess without bleeding: Secondary | ICD-10-CM | POA: Diagnosis not present

## 2014-09-24 DIAGNOSIS — I517 Cardiomegaly: Secondary | ICD-10-CM | POA: Diagnosis not present

## 2014-09-24 HISTORY — PX: GREEN LIGHT LASER TURP (TRANSURETHRAL RESECTION OF PROSTATE: SHX6260

## 2014-09-24 SURGERY — GREEN LIGHT LASER TURP (TRANSURETHRAL RESECTION OF PROSTATE
Anesthesia: General | Wound class: Clean Contaminated

## 2014-09-24 MED ORDER — BELLADONNA ALKALOIDS-OPIUM 16.2-60 MG RE SUPP
RECTAL | Status: DC | PRN
  Administered 2014-09-24: 1 via RECTAL

## 2014-09-24 MED ORDER — BUPIVACAINE HCL 0.5 % IJ SOLN
INTRAMUSCULAR | Status: DC | PRN
  Administered 2014-09-24: 30 mL

## 2014-09-24 MED ORDER — GLYCOPYRROLATE 0.2 MG/ML IJ SOLN
INTRAMUSCULAR | Status: DC | PRN
  Administered 2014-09-24: 0.2 mg via INTRAVENOUS

## 2014-09-24 MED ORDER — MIDAZOLAM HCL 2 MG/2ML IJ SOLN
INTRAMUSCULAR | Status: DC | PRN
  Administered 2014-09-24: 2 mg via INTRAVENOUS

## 2014-09-24 MED ORDER — LACTATED RINGERS IV SOLN
INTRAVENOUS | Status: DC
  Administered 2014-09-24 (×2): via INTRAVENOUS

## 2014-09-24 MED ORDER — FENTANYL CITRATE (PF) 100 MCG/2ML IJ SOLN
25.0000 ug | INTRAMUSCULAR | Status: DC | PRN
  Administered 2014-09-24 (×3): 25 ug via INTRAVENOUS

## 2014-09-24 MED ORDER — MIDAZOLAM HCL 2 MG/2ML IJ SOLN
INTRAMUSCULAR | Status: AC
  Administered 2014-09-24: 2 mg via INTRAVENOUS
  Filled 2014-09-24: qty 2

## 2014-09-24 MED ORDER — BUPIVACAINE HCL (PF) 0.5 % IJ SOLN
INTRAMUSCULAR | Status: AC
  Filled 2014-09-24: qty 30

## 2014-09-24 MED ORDER — OXYCODONE-ACETAMINOPHEN 5-325 MG PO TABS: 1.0000 | ORAL_TABLET | Freq: Four times a day (QID) | ORAL | Status: DC | PRN

## 2014-09-24 MED ORDER — MIDAZOLAM HCL 5 MG/ML IJ SOLN: 1.0000 mg | Freq: Once | INTRAMUSCULAR | Status: DC

## 2014-09-24 MED ORDER — FENTANYL CITRATE (PF) 100 MCG/2ML IJ SOLN
INTRAMUSCULAR | Status: AC
  Filled 2014-09-24: qty 2

## 2014-09-24 MED ORDER — CEFAZOLIN SODIUM 1-5 GM-% IV SOLN: 1.0000 g | INTRAVENOUS | Status: DC

## 2014-09-24 MED ORDER — ONDANSETRON HCL 4 MG/2ML IJ SOLN
INTRAMUSCULAR | Status: DC | PRN
  Administered 2014-09-24: 4 mg via INTRAVENOUS

## 2014-09-24 MED ORDER — CEFAZOLIN SODIUM-DEXTROSE 2-3 GM-% IV SOLR
2.0000 g | Freq: Once | INTRAVENOUS | Status: AC
  Administered 2014-09-24: 2 g via INTRAVENOUS

## 2014-09-24 MED ORDER — BELLADONNA ALKALOIDS-OPIUM 16.2-60 MG RE SUPP
RECTAL | Status: AC
  Filled 2014-09-24: qty 1

## 2014-09-24 MED ORDER — FAMOTIDINE 20 MG PO TABS
ORAL_TABLET | ORAL | Status: AC
  Filled 2014-09-24: qty 1

## 2014-09-24 MED ORDER — LIDOCAINE HCL (CARDIAC) 20 MG/ML IV SOLN
INTRAVENOUS | Status: DC | PRN
  Administered 2014-09-24: 50 mg via INTRAVENOUS

## 2014-09-24 MED ORDER — PROPOFOL 10 MG/ML IV BOLUS
INTRAVENOUS | Status: DC | PRN
  Administered 2014-09-24: 200 mg via INTRAVENOUS

## 2014-09-24 MED ORDER — CEFAZOLIN SODIUM-DEXTROSE 2-3 GM-% IV SOLR
INTRAVENOUS | Status: AC
Start: 2014-09-24 — End: 2014-09-24
  Administered 2014-09-24: 2 g via INTRAVENOUS
  Filled 2014-09-24: qty 50

## 2014-09-24 MED ORDER — FENTANYL CITRATE (PF) 100 MCG/2ML IJ SOLN
INTRAMUSCULAR | Status: DC | PRN
  Administered 2014-09-24: 50 ug via INTRAVENOUS

## 2014-09-24 MED ORDER — ONDANSETRON HCL 4 MG/2ML IJ SOLN: 4.0000 mg | Freq: Once | INTRAMUSCULAR | Status: DC | PRN

## 2014-09-24 SURGICAL SUPPLY — 25 items
ADAPTER IRRIG TUBE 2 SPIKE SOL (ADAPTER) ×2 IMPLANT
BAG URO DRAIN 2000ML W/SPOUT (MISCELLANEOUS) ×2 IMPLANT
BAG URO DRAIN 4000ML (MISCELLANEOUS) ×2 IMPLANT
CATH FOL 2WAY LX 20X30 (CATHETERS) ×2 IMPLANT
CATH FOL LEG HOLDER (MISCELLANEOUS) ×2 IMPLANT
CATH FOLEY 3WAY 30CC 22FR (CATHETERS) IMPLANT
CATH FOLEY 3WAY 30CC 24FR (CATHETERS)
CATH URTH STD 24FR FL 3W 2 (CATHETERS) IMPLANT
GLOVE BIO SURGEON STRL SZ7 (GLOVE) ×4 IMPLANT
GLOVE BIO SURGEON STRL SZ7.5 (GLOVE) ×2 IMPLANT
GOWN STRL REUS W/ TWL LRG LVL3 (GOWN DISPOSABLE) ×1 IMPLANT
GOWN STRL REUS W/ TWL XL LVL3 (GOWN DISPOSABLE) ×1 IMPLANT
GOWN STRL REUS W/TWL LRG LVL3 (GOWN DISPOSABLE) ×1
GOWN STRL REUS W/TWL XL LVL3 (GOWN DISPOSABLE) ×1
LASER GREENLIGHT XPS PROCEDURE (MISCELLANEOUS) ×2 IMPLANT
LASER GRNLGT 950 (MISCELLANEOUS) ×2 IMPLANT
PACK CYSTO AR (MISCELLANEOUS) ×2 IMPLANT
PAD GROUND ADULT SPLIT (MISCELLANEOUS) IMPLANT
PREP PVP WINGED SPONGE (MISCELLANEOUS) ×2 IMPLANT
SET IRRIG Y TYPE TUR BLADDER L (SET/KITS/TRAYS/PACK) ×2 IMPLANT
SOL .9 NS 3000ML IRR  AL (IV SOLUTION) ×4
SOL .9 NS 3000ML IRR UROMATIC (IV SOLUTION) ×4 IMPLANT
SYRINGE IRR TOOMEY STRL 70CC (SYRINGE) ×2 IMPLANT
TUBING CONNECTING 10 (TUBING) ×2 IMPLANT
WATER STERILE IRR 1000ML POUR (IV SOLUTION) ×2 IMPLANT

## 2014-09-24 NOTE — H&P (Signed)
  Date of Initial H&P: 09/24/14  History reviewed, patient examined, no change in status, stable for surgery.HS RRR Lungs CTA

## 2014-09-24 NOTE — Progress Notes (Signed)
Foley Catheter removed as ordered by MD order, pt unable to tolerate.  Self caths at home.

## 2014-09-24 NOTE — Transfer of Care (Signed)
Immediate Anesthesia Transfer of Care Note  Patient: Martin Hardy.  Procedure(s) Performed: Procedure(s): GREEN LIGHT LASER TURP (TRANSURETHRAL RESECTION OF PROSTATE (N/A)  Patient Location: PACU  Anesthesia Type:General  Level of Consciousness: Alert, Awake, Oriented  Airway & Oxygen Therapy: Patient Spontanous Breathing  Post-op Assessment: Report given to RN  Post vital signs: Reviewed and stable  Last Vitals:  Filed Vitals:   09/24/14 0816  BP: 109/69  Pulse: 63  Temp: 36.2 C  Resp: 9    Complications: No apparent anesthesia complications

## 2014-09-24 NOTE — Discharge Instructions (Addendum)
Drink 2 qts of water daily   AMBULATORY SURGERY  DISCHARGE INSTRUCTIONS   1) The drugs that you were given will stay in your system until tomorrow so for the next 24 hours you should not:  A) Drive an automobile B) Make any legal decisions C) Drink any alcoholic beverage   2) You may resume regular meals tomorrow.  Today it is better to start with liquids and gradually work up to solid foods.  You may eat anything you prefer, but it is better to start with liquids, then soup and crackers, and gradually work up to solid foods.   3) Please notify your doctor immediately if you have any unusual bleeding, trouble breathing, redness and pain at the surgery site, drainage, fever, or pain not relieved by medication.                                                            Marland Kitchen  4) Additional Instructions:

## 2014-09-24 NOTE — Op Note (Signed)
Preoperative diagnosis:  1. 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 As above   Procedure:  1. Cystoscopy, greenlight laser ablation prostatectomy  Surgeon: Ardis Hughs, MD   Anesthesia: General   Complications: None  Intraoperative findings: median lobe enlargement with some lateral lobe enlargement Lasing time: 13 min  Joules: [id number] Watts:  120W  EBL: Minimal  Specimens: None  Indication: [name]. is a 69 y.o. patient with outlet obstruction from middle and lateral lobe enlargement of the prostate.  After reviewing the management options for treatment, he elected to proceed with the above surgical procedure(s). We have discussed the potential benefits and risks of the procedure, side effects of the proposed treatment, the likelihood of the patient achieving the goals of the procedure, and any potential problems that might occur during the procedure or recuperation. Informed consent has been obtained.  Description of procedure: The patient was taken to the operating room and general anesthesia was induced.  The patient was placed in the dorsal lithotomy position, prepped and draped in the usual sterile fashion, and preoperative antibiotics were administered. A preoperative time-out was performed.   The patient was taken to the operating room and general anesthesia was induced. The patient was placed in the dorsal lithotomy position, prepped and draped in the usual sterile fashion, and preoperative antibiotics were administered. A preoperative time-out was performed.   A 22 French cystoscope was then gently passed to the patient's urethra with the visual obturator the laser bridge sheath. Visual obturator was then exchanged for the laser bridge. I then performed a routine laser ablative prostatectomy starting at 7:00 the bladder neck and ablating the tissue down to the through the right prostatic apex creating a nice  groove and delineating the margins of right lateral lobe. Then in a systematic fashion I ablated the tissues starting at 11:00 through 7:00 at the bladder neck working down to the apex. I then repeated this process on the right side starting at approximately 5:00 at the bladder neck and working down to the right prostatic apex. I then systematically ablated the left lateral lobe starting at 1:00 through 5:00 the bladder neck and working down to the apex. I then turned my attention to the median lobe and systematically ablated the median lobe taking care not to undermine the bladder neck and staying well away from the ureteral orifices. Once an adequate channel had been created, all arterial bleeders were cauterized an 37F Foley catheter was placed into the patient's urethra and the bladder irrigated until the effluent was clear. The patient was subsequently extubated and returned to the PACU in excellent condition. There no immediate complications.  Disposition:  The patient will be discharged home today with Foley catheter placed, and return in 2 days for a voiding trial.

## 2014-09-24 NOTE — Anesthesia Postprocedure Evaluation (Signed)
  Anesthesia Post-op Note  Patient: Martin Hardy.  Procedure(s) Performed: Procedure(s): GREEN LIGHT LASER TURP (TRANSURETHRAL RESECTION OF PROSTATE (N/A)  Anesthesia type:General  Patient location: PACU  Post pain: Pain level controlled  Post assessment: Post-op Vital signs reviewed, Patient's Cardiovascular Status Stable, Respiratory Function Stable, Patent Airway and No signs of Nausea or vomiting  Post vital signs: Reviewed and stable  Last Vitals:  Filed Vitals:   09/24/14 0900  BP: 118/72  Pulse: 64  Temp: 36.5 C  Resp: 18    Level of consciousness: awake, alert  and patient cooperative  Complications: No apparent anesthesia complications

## 2014-09-24 NOTE — Anesthesia Procedure Notes (Signed)
Procedure Name: LMA Insertion Date/Time: 09/24/2014 7:36 AM Performed by: Eliberto Ivory Pre-anesthesia Checklist: Patient identified, Patient being monitored, Timeout performed, Emergency Drugs available and Suction available Patient Re-evaluated:Patient Re-evaluated prior to inductionOxygen Delivery Method: Circle system utilized Preoxygenation: Pre-oxygenation with 100% oxygen Intubation Type: IV induction Ventilation: Mask ventilation without difficulty LMA: LMA inserted LMA Size: 4.0 Tube type: Oral Number of attempts: 1 Placement Confirmation: positive ETCO2 and breath sounds checked- equal and bilateral Tube secured with: Tape Dental Injury: Teeth and Oropharynx as per pre-operative assessment

## 2014-09-24 NOTE — Anesthesia Preprocedure Evaluation (Addendum)
Anesthesia Evaluation  Patient identified by MRN, date of birth, ID band Patient awake    Reviewed: Allergy & Precautions, NPO status , Patient's Chart, lab work & pertinent test results, reviewed documented beta blocker date and time   Airway Mallampati: III  TM Distance: >3 FB     Dental  (+) Chipped, Poor Dentition   Pulmonary sleep apnea ,          Cardiovascular hypertension, +CHF + dysrhythmias + pacemaker + Cardiac Defibrillator     Neuro/Psych Depression    GI/Hepatic PUD,   Endo/Other    Renal/GU      Musculoskeletal   Abdominal   Peds  Hematology   Anesthesia Other Findings Uses CPAP.  Reproductive/Obstetrics                            Anesthesia Physical Anesthesia Plan  ASA: III  Anesthesia Plan: General   Post-op Pain Management:    Induction: Intravenous  Airway Management Planned: LMA  Additional Equipment:   Intra-op Plan:   Post-operative Plan:   Informed Consent: I have reviewed the patients History and Physical, chart, labs and discussed the procedure including the risks, benefits and alternatives for the proposed anesthesia with the patient or authorized representative who has indicated his/her understanding and acceptance.     Plan Discussed with: CRNA  Anesthesia Plan Comments:         Anesthesia Quick Evaluation

## 2014-09-25 ENCOUNTER — Encounter: Payer: Self-pay | Admitting: Urology

## 2014-09-27 ENCOUNTER — Other Ambulatory Visit: Payer: Self-pay | Admitting: *Deleted

## 2014-09-27 ENCOUNTER — Encounter: Payer: Self-pay | Admitting: *Deleted

## 2014-09-27 DIAGNOSIS — I6529 Occlusion and stenosis of unspecified carotid artery: Secondary | ICD-10-CM | POA: Insufficient documentation

## 2014-09-27 DIAGNOSIS — IMO0002 Reserved for concepts with insufficient information to code with codable children: Secondary | ICD-10-CM | POA: Insufficient documentation

## 2014-09-27 DIAGNOSIS — F1020 Alcohol dependence, uncomplicated: Secondary | ICD-10-CM | POA: Insufficient documentation

## 2014-09-27 DIAGNOSIS — M5430 Sciatica, unspecified side: Secondary | ICD-10-CM | POA: Insufficient documentation

## 2014-09-27 DIAGNOSIS — I10 Essential (primary) hypertension: Secondary | ICD-10-CM | POA: Insufficient documentation

## 2014-09-27 DIAGNOSIS — I509 Heart failure, unspecified: Secondary | ICD-10-CM | POA: Insufficient documentation

## 2014-10-02 ENCOUNTER — Encounter: Payer: Self-pay | Admitting: Urology

## 2014-10-05 ENCOUNTER — Encounter: Payer: Self-pay | Admitting: Urology

## 2014-10-05 ENCOUNTER — Ambulatory Visit: Payer: Self-pay | Admitting: Urology

## 2014-10-09 ENCOUNTER — Ambulatory Visit (INDEPENDENT_AMBULATORY_CARE_PROVIDER_SITE_OTHER): Payer: Medicare Other | Admitting: Urology

## 2014-10-09 ENCOUNTER — Encounter: Payer: Self-pay | Admitting: Urology

## 2014-10-09 VITALS — BP 171/87 | HR 54 | Ht 69.0 in | Wt 228.3 lb

## 2014-10-09 DIAGNOSIS — R338 Other retention of urine: Principal | ICD-10-CM

## 2014-10-09 DIAGNOSIS — N400 Enlarged prostate without lower urinary tract symptoms: Secondary | ICD-10-CM

## 2014-10-09 DIAGNOSIS — N401 Enlarged prostate with lower urinary tract symptoms: Secondary | ICD-10-CM

## 2014-10-09 DIAGNOSIS — R310 Gross hematuria: Secondary | ICD-10-CM

## 2014-10-09 LAB — URINALYSIS, COMPLETE
BILIRUBIN UA: NEGATIVE
Glucose, UA: NEGATIVE
KETONES UA: NEGATIVE
NITRITE UA: NEGATIVE
PH UA: 6 (ref 5.0–7.5)
Specific Gravity, UA: 1.025 (ref 1.005–1.030)
Urobilinogen, Ur: 4 mg/dL — ABNORMAL HIGH (ref 0.2–1.0)

## 2014-10-09 LAB — MICROSCOPIC EXAMINATION: Bacteria, UA: NONE SEEN

## 2014-10-09 LAB — BLADDER SCAN AMB NON-IMAGING: SCAN RESULT: 0

## 2014-10-09 NOTE — Progress Notes (Signed)
10/09/2014 3:10 PM   Martin Hardy 06/18/1945 [id number]  Referring provider: Adrian Prows, MD [place], [place] [address]  Chief Complaint  Patient presents with  . Follow-up    post op     HPI: 69 year old male status post greenlight laser ablation of the prostate by Dr. [name] on 09/24/2014.  He has a history of BPH with an enlarged median lobe in a trabeculated bladder. He also has a history of urinary retention was self cathetering prior to surgery.  He reports that he is doing quite well today although had an episode of gross hematuria this morning.  He reports that he passed a small clot or piece of debris in his urine is since cleared. He denies any dysuria, abdominal pain, difficulty emptying his bladder, fevers or chills.  He is quite pleased with his overall result. He no longer needed to self catheterize himself and denies any significant urgency or frequency.   PVR today minimal.    PMH: Past Medical History  Diagnosis Date  . Hypertension   . Dysrhythmia   . AICD (automatic cardioverter/defibrillator) present   . BPH (benign prostatic hyperplasia)   . Obesity   . Depression   . Cancer     rectal  . Cardiomyopathy   . PUD (peptic ulcer disease)   . Alcohol abuse   . Presence of permanent cardiac pacemaker   . Enlarged heart   . Sleep apnea     cpap  . CHF (congestive heart failure)     CHRONIC    Surgical History: Past Surgical History  Procedure Laterality Date  . Hernia repair    . Foot neuroma surgery    . Vagotomy    . Ep implantable device    . Green light laser turp (transurethral resection of prostate N/A 09/24/2014    Procedure: GREEN LIGHT LASER TURP (TRANSURETHRAL RESECTION OF PROSTATE;  Surgeon: Collier Flowers, MD;  Location: [facility] ORS;  Service: Urology;  Laterality: N/A;    Home Medications:    Medication List       This list is accurate as of: 10/09/14  3:10 PM.  Always use your most recent med list.               aspirin EC 81 MG tablet  Take by mouth.     carvedilol 25 MG tablet  Commonly known as:  COREG  Take 25 mg by mouth 2 (two) times daily with a meal.     finasteride 5 MG tablet  Commonly known as:  PROSCAR     fluticasone 50 MCG/ACT nasal spray  Commonly known as:  FLONASE  Place into the nose.     hydrochlorothiazide 25 MG tablet  Commonly known as:  HYDRODIURIL  Take 25 mg by mouth daily.     HYDROcodone-acetaminophen 5-325 MG per tablet  Commonly known as:  NORCO/VICODIN  Take 1 tablet by mouth every 6 (six) hours as needed for moderate pain.     lisinopril 40 MG tablet  Commonly known as:  PRINIVIL,ZESTRIL  Take 40 mg by mouth daily. pm     methocarbamol 500 MG tablet  Commonly known as:  ROBAXIN     omeprazole 20 MG capsule  Commonly known as:  PRILOSEC  Take 20 mg by mouth daily. pm     oxybutynin 5 MG tablet  Commonly known as:  DITROPAN     oxyCODONE-acetaminophen 5-325 MG per tablet  Commonly known as:  ROXICET  Take 1 tablet by mouth every 6 (six) hours as needed for severe pain.     pregabalin 100 MG capsule  Commonly known as:  LYRICA  Take by mouth 2 (two) times daily.     venlafaxine 75 MG tablet  Commonly known as:  EFFEXOR  Take 75 mg by mouth 2 (two) times daily. pm        Allergies: No Known Allergies  Family History: No family history on file.  Social History:  reports that he has never smoked. His smokeless tobacco use includes Snuff. He reports that he drinks alcohol. His drug history is not on file.   Physical Exam: BP 171/87 mmHg  Pulse 54  Ht 5\' 9"  (1.753 m)  Wt 228 lb 4.8 oz (103.556 kg)  BMI 33.70 kg/m2  Constitutional:  Alert and oriented, No acute distress.  Pesents to the office today with his wife. HEENT: [place] AT, moist mucus membranes.  Trachea midline, no masses. Cardiovascular: No clubbing, cyanosis, or edema. Respiratory: Normal respiratory effort, no increased work of breathing. GI: Abdomen is soft,  nontender, nondistended, no abdominal masses Skin: No rashes, bruises or suspicious lesions. Neurologic: Grossly intact, no focal deficits, moving all 4 extremities. Psychiatric: Normal mood and affect.  Laboratory Data: Lab Results  Component Value Date   WBC 10.8* 09/13/2014   HGB 15.9 09/13/2014   HCT 47.6 09/13/2014   MCV 90.1 09/13/2014   PLT 112* 09/13/2014    Lab Results  Component Value Date   CREATININE 0.87 09/13/2014    Urinalysis UA reviewed today, greater than 30 red blood cells per high-powered field, 6-10 white blood cells per high-power field, nitrate negative, otherwise unremarkable.  Results for orders placed or performed in visit on 10/09/14  BLADDER SCAN AMB NON-IMAGING  Result Value Ref Range   Scan Result 0      Assessment & Plan:  69 year old male 2 weeks status post greenlight laser ablation of the prostate for BPH with an episode of gross hematuria this morning. I explained that this is quite normal postoperatively in UA is not particularly suspicious for infection. We will go ahead and send a urine culture rule out infection although this is unlikely. Postvoid residual today was 0 is overall doing very well.   1. BPH (benign prostatic hypertrophy) with urinary retention - Urinalysis, Complete - BLADDER SCAN AMB NON-IMAGING -OK to stop finasteride  2. Gross hematuria     Return in about 3 months (around 01/09/2015) for Dr. [name] f/u with Uroflow/ PVR.  Hollice Espy, MD  [facility] [address] [place], [place] [address] [phone]

## 2014-10-09 NOTE — Patient Instructions (Addendum)

## 2014-10-11 ENCOUNTER — Encounter: Payer: Self-pay | Admitting: Urology

## 2014-10-11 LAB — URINE CULTURE

## 2014-10-14 LAB — CULTURE, URINE COMPREHENSIVE

## 2014-10-17 ENCOUNTER — Other Ambulatory Visit: Payer: Self-pay | Admitting: Physical Medicine and Rehabilitation

## 2014-10-17 DIAGNOSIS — M5417 Radiculopathy, lumbosacral region: Secondary | ICD-10-CM

## 2014-10-21 ENCOUNTER — Emergency Department
Admission: EM | Admit: 2014-10-21 | Discharge: 2014-10-21 | Disposition: A | Discharge status: Home / Self Care | Payer: Medicare Other | Attending: Emergency Medicine | Admitting: Emergency Medicine

## 2014-10-21 DIAGNOSIS — R339 Retention of urine, unspecified: Secondary | ICD-10-CM | POA: Insufficient documentation

## 2014-10-21 DIAGNOSIS — I10 Essential (primary) hypertension: Secondary | ICD-10-CM | POA: Insufficient documentation

## 2014-10-21 DIAGNOSIS — R319 Hematuria, unspecified: Secondary | ICD-10-CM

## 2014-10-21 DIAGNOSIS — R338 Other retention of urine: Secondary | ICD-10-CM

## 2014-10-21 LAB — COMPREHENSIVE METABOLIC PANEL
ALK PHOS: 63 U/L (ref 38–126)
ALT: 22 U/L (ref 17–63)
AST: 29 U/L (ref 15–41)
Albumin: 3.8 g/dL (ref 3.5–5.0)
Anion gap: 9 (ref 5–15)
BUN: 10 mg/dL (ref 6–20)
CHLORIDE: 103 mmol/L (ref 101–111)
CO2: 23 mmol/L (ref 22–32)
Calcium: 9.1 mg/dL (ref 8.9–10.3)
Creatinine, Ser: 0.78 mg/dL (ref 0.61–1.24)
GFR calc Af Amer: >60 mL/min (ref >60)
GFR calc non Af Amer: >60 mL/min (ref >60)
Glucose, Bld: 115 mg/dL — ABNORMAL HIGH (ref 65–99)
Potassium: 3 mmol/L — ABNORMAL LOW (ref 3.5–5.1)
SODIUM: 135 mmol/L (ref 135–145)
Total Bilirubin: 0.7 mg/dL (ref 0.3–1.2)
Total Protein: 7 g/dL (ref 6.5–8.1)

## 2014-10-21 LAB — CBC WITH DIFFERENTIAL/PLATELET
BASOS ABS: 0.1 10*3/uL (ref 0–0.1)
Basophils Relative: 1 %
EOS ABS: 0.5 10*3/uL (ref 0–0.7)
Eosinophils Relative: 6 %
HCT: 46.6 % (ref 40.0–52.0)
HEMOGLOBIN: 15.8 g/dL (ref 13.0–18.0)
Lymphocytes Relative: 22 %
Lymphs Abs: 2 10*3/uL (ref 1.0–3.6)
MCH: 30.1 pg (ref 26.0–34.0)
MCHC: 33.8 g/dL (ref 32.0–36.0)
MCV: 89.1 fL (ref 80.0–100.0)
Monocytes Absolute: 0.9 10*3/uL (ref 0.2–1.0)
Monocytes Relative: 10 %
NEUTROS ABS: 5.3 10*3/uL (ref 1.4–6.5)
Neutrophils Relative %: 61 %
Platelets: 129 10*3/uL — ABNORMAL LOW (ref 150–440)
RBC: 5.24 MIL/uL (ref 4.40–5.90)
RDW: 13.8 % (ref 11.5–14.5)
WBC: 8.8 10*3/uL (ref 3.8–10.6)

## 2014-10-21 LAB — URINALYSIS COMPLETE WITH MICROSCOPIC (ARMC ONLY)
SPECIFIC GRAVITY, URINE: 1.009 (ref 1.005–1.030)
Squamous Epithelial / LPF: NONE SEEN

## 2014-10-21 MED ORDER — OXYCODONE-ACETAMINOPHEN 5-325 MG PO TABS: 1.0000 | ORAL_TABLET | Freq: Four times a day (QID) | ORAL | Status: DC | PRN

## 2014-10-21 MED ORDER — CEFTRIAXONE SODIUM IN DEXTROSE 20 MG/ML IV SOLN
1.0000 g | Freq: Once | INTRAVENOUS | Status: AC
  Administered 2014-10-21: 1 g via INTRAVENOUS

## 2014-10-21 MED ORDER — CIPROFLOXACIN HCL 500 MG PO TABS: 500.0000 mg | ORAL_TABLET | Freq: Two times a day (BID) | ORAL | Status: AC

## 2014-10-21 MED ORDER — CEFTRIAXONE SODIUM IN DEXTROSE 20 MG/ML IV SOLN
INTRAVENOUS | Status: AC
  Administered 2014-10-21: 1 g via INTRAVENOUS
  Filled 2014-10-21: qty 50

## 2014-10-21 NOTE — ED Notes (Signed)
Patient with no complaints at this time. Respirations even and unlabored. Skin warm/dry. Discharge instructions reviewed with patient at this time. Patient given opportunity to voice concerns/ask questions. IV removed per policy and band-aid applied to site. Patient discharged at this time and left Emergency Department with steady gait.  

## 2014-10-21 NOTE — ED Provider Notes (Signed)
[facility] Emergency Department Provider Note     Time seen: ----------------------------------------- 7:01 PM on 10/21/2014 -----------------------------------------    I have reviewed the triage vital signs and the nursing notes.   HISTORY  Chief Complaint Penile Discharge    HPI Martin Hardy. is a 69 y.o. male who presents ER for hematuria started about one hour prior to arrival. Patient states he had prostate surgery 3 weeks ago and is had some burning with urination with past several days. Patient states he thought he had an episode of urinary incontinence, realized he was bleeding. Denies fevers chills other complaints. On arrival here is noted to have distended lower abdomen and in the inability to urinate. Pain is severe nothing makes it better or worse. Does not take any blood thinners   Past Medical History  Diagnosis Date  . Hypertension   . Dysrhythmia   . AICD (automatic cardioverter/defibrillator) present   . BPH (benign prostatic hyperplasia)   . Obesity   . Depression   . Cancer     rectal  . Cardiomyopathy   . PUD (peptic ulcer disease)   . Alcohol abuse   . Presence of permanent cardiac pacemaker   . Enlarged heart   . Sleep apnea     cpap  . CHF (congestive heart failure)     CHRONIC    Patient Active Problem List   Diagnosis Date Noted  . Carotid artery narrowing 09/27/2014  . CCF (congestive cardiac failure) 09/27/2014  . Alcohol addiction 09/27/2014  . BP (high blood pressure) 09/27/2014  . Neuralgia neuritis, sciatic nerve 09/27/2014  . Change in blood platelet count 09/27/2014  . Apnea, sleep 02/28/2014  . Cardiac defibrillator in place 02/28/2014  . Cardiomyopathy 02/28/2014  . H/O cardiac catheterization 02/28/2014  . HLD (hyperlipidemia) 02/28/2014  . Benign fibroma of prostate 02/20/2014  . DDD (degenerative disc disease), lumbar 01/24/2014  . Neuritis or radiculitis due to rupture of lumbar  intervertebral disc 01/22/2014    Past Surgical History  Procedure Laterality Date  . Hernia repair    . Foot neuroma surgery    . Vagotomy    . Ep implantable device    . Green light laser turp (transurethral resection of prostate N/A 09/24/2014    Procedure: GREEN LIGHT LASER TURP (TRANSURETHRAL RESECTION OF PROSTATE;  Surgeon: Collier Flowers, MD;  Location: [facility] ORS;  Service: Urology;  Laterality: N/A;    Allergies Review of patient's allergies indicates no known allergies.  Social History History  Substance Use Topics  . Smoking status: Never Smoker   . Smokeless tobacco: Current User    Types: Snuff  . Alcohol Use: Yes     Comment: 33 OZ/WEEK    Review of Systems Constitutional: Negative for fever. Eyes: Negative for visual changes. ENT: Negative for sore throat. Cardiovascular: Negative for chest pain. Respiratory: Negative for shortness of breath. Gastrointestinal: Positive for abdominal pain, negative for vomiting and diarrhea. Genitourinary: Positive for dysuria and hematuria Musculoskeletal: Negative for back pain. Skin: Negative for rash. Neurological: Negative for headaches, focal weakness or numbness.  10-point ROS otherwise negative.  ____________________________________________   PHYSICAL EXAM:  VITAL SIGNS: ED Triage Vitals  Enc Vitals Group     BP 10/21/14 1816 172/78 mmHg     Pulse Rate 10/21/14 1816 82     Resp 10/21/14 1816 18     Temp 10/21/14 1816 98.7 F (37.1 C)     Temp Source 10/21/14 1816 Oral  SpO2 10/21/14 1816 96 %     Weight --      Height --      Head Cir --      Peak Flow --      Pain Score 10/21/14 1827 0     Pain Loc --      Pain Edu? --      Excl. in [place]? --     Constitutional: Alert and oriented. Mild to moderate distress Eyes: Conjunctivae are normal. PERRL. Normal extraocular movements. ENT   Head: Normocephalic and atraumatic.   Nose: No congestion/rhinnorhea.   Mouth/Throat: Mucous membranes are  moist.   Neck: No stridor. Hematological/Lymphatic/Immunilogical: No cervical lymphadenopathy. Cardiovascular: Normal rate, regular rhythm. Normal and symmetric distal pulses are present in all extremities. No murmurs, rubs, or gallops. Respiratory: Normal respiratory effort without tachypnea nor retractions. Breath sounds are clear and equal bilaterally. No wheezes/rales/rhonchi. Gastrointestinal: Tender, suprapubic distention and tenderness. No rebound or guarding. Genitourinary: Patient is uncircumcised, frank blood coming from urethra Musculoskeletal: Nontender with normal range of motion in all extremities. No joint effusions.  No lower extremity tenderness nor edema. Neurologic:  Normal speech and language. No gross focal neurologic deficits are appreciated. Speech is normal. No gait instability. Skin:  Skin is warm, dry and intact. No rash noted. Psychiatric: Mood and affect are normal. Speech and behavior are normal. Patient exhibits appropriate insight and judgment.   ____________________________________________  ED COURSE:  Pertinent labs & imaging results that were available during my care of the patient were reviewed by me and considered in my medical decision making (see chart for details). Patient will need Foley catheter, labs and reevaluation. ____________________________________________    LABS (pertinent positives/negatives)  Labs Reviewed  CBC WITH DIFFERENTIAL/PLATELET - Abnormal; Notable for the following:    Platelets 129 (*)    All other components within normal limits  COMPREHENSIVE METABOLIC PANEL - Abnormal; Notable for the following:    Potassium 3.0 (*)    Glucose, Bld 115 (*)    All other components within normal limits  URINALYSIS COMPLETEWITH MICROSCOPIC ([facility] ONLY) - Abnormal; Notable for the following:    Color, Urine RED (*)    Bacteria, UA MANY (*)    All other components within normal limits  URINE CULTURE     RADIOLOGY  None  ____________________________________________  FINAL ASSESSMENT AND PLAN  Hematuria, cystitis  Plan: Patient was given Foley catheter and his urine seemed to clear nicely. This relieved his acute urinary retention. He will continue home with Cipro. I have sent a urine culture, have close follow-up with urology for reevaluation.   Earleen Newport, MD   Earleen Newport, MD 10/21/14 2121

## 2014-10-21 NOTE — Discharge Instructions (Signed)
Hematuria Hematuria is blood in your urine. It can be caused by a bladder infection, kidney infection, prostate infection, kidney stone, or cancer of your urinary tract. Infections can usually be treated with medicine, and a kidney stone usually will pass through your urine. If neither of these is the cause of your hematuria, further workup to find out the reason may be needed. It is very important that you tell your health care provider about any blood you see in your urine, even if the blood stops without treatment or happens without causing pain. Blood in your urine that happens and then stops and then happens again can be a symptom of a very serious condition. Also, pain is not a symptom in the initial stages of many urinary cancers. HOME CARE INSTRUCTIONS   Drink lots of fluid, 3-4 quarts a day. If you have been diagnosed with an infection, cranberry juice is especially recommended, in addition to large amounts of water.  Avoid caffeine, tea, and carbonated beverages because they tend to irritate the bladder.  Avoid alcohol because it may irritate the prostate.  Take all medicines as directed by your health care provider.  If you were prescribed an antibiotic medicine, finish it all even if you start to feel better.  If you have been diagnosed with a kidney stone, follow your health care provider's instructions regarding straining your urine to catch the stone.  Empty your bladder often. Avoid holding urine for long periods of time.  After a bowel movement, women should cleanse front to back. Use each tissue only once.  Empty your bladder before and after sexual intercourse if you are a male. SEEK MEDICAL CARE IF:  You develop back pain.  You have a fever.  You have a feeling of sickness in your stomach (nausea) or vomiting.  Your symptoms are not better in 3 days. Return sooner if you are getting worse. SEEK IMMEDIATE MEDICAL CARE IF:   You develop severe vomiting and are  unable to keep the medicine down.  You develop severe back or abdominal pain despite taking your medicines.  You begin passing a large amount of blood or clots in your urine.  You feel extremely weak or faint, or you pass out. MAKE SURE YOU:   Understand these instructions.  Will watch your condition.  Will get help right away if you are not doing well or get worse. Document Released: 04/20/2005 Document Revised: 09/04/2013 Document Reviewed: 12/19/2012 [facility] Patient Information 2015 [place], [place]. This information is not intended to replace advice given to you by your health care provider. Make sure you discuss any questions you have with your health care provider.  Acute Urinary Retention Acute urinary retention is the temporary inability to urinate. This is a common problem in older men. As men age their prostates become larger and block the flow of urine from the bladder. This is usually a problem that has come on gradually.  HOME CARE INSTRUCTIONS If you are sent home with a Foley catheter and a drainage system, you will need to discuss the best course of action with your health care provider. While the catheter is in, maintain a good intake of fluids. Keep the drainage bag emptied and lower than your catheter. This is so that contaminated urine will not flow back into your bladder, which could lead to a urinary tract infection. There are two main types of drainage bags. One is a large bag that usually is used at night. It has a good capacity  that will allow you to sleep through the night without having to empty it. The second type is called a leg bag. It has a smaller capacity, so it needs to be emptied more frequently. However, the main advantage is that it can be attached by a leg strap and can go underneath your clothing, allowing you the freedom to move about or leave your home. Only take over-the-counter or prescription medicines for pain, discomfort, or fever as directed by  your health care provider.  SEEK MEDICAL CARE IF:  You develop a low-grade fever.  You experience spasms or leakage of urine with the spasms. SEEK IMMEDIATE MEDICAL CARE IF:   You develop chills or fever.  Your catheter stops draining urine.  Your catheter falls out.  You start to develop increased bleeding that does not respond to rest and increased fluid intake. MAKE SURE YOU:  Understand these instructions.  Will watch your condition.  Will get help right away if you are not doing well or get worse. Document Released: 07/27/2000 Document Revised: 04/25/2013 Document Reviewed: 09/29/2012 [facility] Patient Information 2015 [place], [place]. This information is not intended to replace advice given to you by your health care provider. Make sure you discuss any questions you have with your health care provider.

## 2014-10-21 NOTE — ED Notes (Signed)
Bladder scan result of 621 ml.

## 2014-10-21 NOTE — ED Notes (Signed)
Patient presents to the ED with bleeding from penis that started about 1 hour prior to arrival.  Patient reports having a prostate surgery about 3 weeks ago.  Patient states he has had burning during urination for the past several days.  Patient states he thought he had an episode of urinary incontinence, patient reached down and realized he was bleeding.  Patient is alert and oriented, denies any pain.  Denies abdominal pain.

## 2014-10-22 ENCOUNTER — Encounter: Payer: Self-pay | Admitting: *Deleted

## 2014-10-22 ENCOUNTER — Ambulatory Visit (INDEPENDENT_AMBULATORY_CARE_PROVIDER_SITE_OTHER): Payer: Medicare Other | Admitting: Urology

## 2014-10-22 VITALS — BP 189/111 | HR 71 | Ht 69.0 in | Wt 228.0 lb

## 2014-10-22 DIAGNOSIS — Z9889 Other specified postprocedural states: Secondary | ICD-10-CM

## 2014-10-22 DIAGNOSIS — R310 Gross hematuria: Secondary | ICD-10-CM | POA: Insufficient documentation

## 2014-10-22 LAB — URINALYSIS, COMPLETE

## 2014-10-22 LAB — MICROSCOPIC EXAMINATION: RBC, UA: >30 /hpf — AB (ref 0–?)

## 2014-10-22 NOTE — Progress Notes (Signed)
Catheter Removal  Patient is present today for a catheter removal.  62ml of water was drained from the balloon. A 14FR foley cath was removed from the bladder no complications were noted . Patient tolerated well.  Preformed by: Toniann Fail, LPN
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
## 2014-10-25 ENCOUNTER — Ambulatory Visit: Payer: Medicare Other

## 2014-10-26 LAB — URINE CULTURE: Culture: >=100000

## 2014-10-27 ENCOUNTER — Telehealth: Payer: Self-pay | Admitting: Emergency Medicine

## 2014-10-27 NOTE — Progress Notes (Signed)
ED Antimicrobial Stewardship Positive Culture Follow Up   Martin Mckim. is an 69 y.o. male who presented to [facility] on 10/21/2014 with a chief complaint of  Chief Complaint  Patient presents with  . Penile Discharge    Recent Results (from the past 720 hour(s))  Urine culture     Status: None   Collection Time: 10/09/14 12:00 AM  Result Value Ref Range Status   Urine Culture, Routine Final report  Corrected    Comment: AS PER MARIE KELLY 10/10/2014-[place]   Result 1 Comment  Corrected    Comment: Culture shows less than 10,000 colony forming units of bacteria per milliliter of urine. This colony count is not generally considered to be clinically significant.   CULTURE, URINE COMPREHENSIVE     Status: None   Collection Time: 10/09/14 12:00 AM  Result Value Ref Range Status   Urine Culture, Comprehensive Final report  Final   Result 1 Comment  Final    Comment: Mixed urogenital flora 10,000-25,000 colony forming units per mL   Microscopic Examination     Status: Abnormal   Collection Time: 10/09/14  2:53 PM  Result Value Ref Range Status   WBC, UA 6-10 (A) 0 -  5 /hpf Final   RBC, UA >30 (A) 0 -  2 /hpf Final   Epithelial Cells (non renal) 0-10 0 - 10 /hpf Final   Renal Epithel, UA 0-10 (A) None seen /hpf Final   Bacteria, UA None seen None seen/Few Final  Urine culture     Status: None   Collection Time: 10/21/14  7:07 PM  Result Value Ref Range Status   Specimen Description URINE, CATHETERIZED  Final   Special Requests NONE  Final   Culture   Final    >=100,000 COLONIES/mL ENTEROCOCCUS FAECALIS 50,000 COLONIES/mL STREPTOCOCCUS AGALACTIAE <10,000 COLONIES/mL ACHROMOBACTER DENITRIFICANS There is no known Penicillin Resistant Beta Streptococcus in the [place] For patients that are Penicillin-allergic, Erythromycin is 85-94% susceptible, and Clindamycin is 80% susceptible.  Contact Microbiology within 7 days if sensitivity testing is  required.      Report Status  10/26/2014 FINAL  Final   Organism ID, Bacteria ENTEROCOCCUS FAECALIS  Final   Organism ID, Bacteria ACHROMOBACTER DENITRIFICANS  Final      Susceptibility   Enterococcus faecalis - MIC*    AMPICILLIN <=2 SENSITIVE Sensitive     LINEZOLID 2 SENSITIVE Sensitive     * >=100,000 COLONIES/mL ENTEROCOCCUS FAECALIS   Achromobacter denitrificans - MIC*    CEFTAZIDIME Value in next row       2SENSITIVE    GENTAMICIN Value in next row       4SENSITIVE    CIPROFLOXACIN Value in next row       2INTERMEDIATE    IMIPENEM Value in next row       0.5SENSITIVE    TRIMETH/SULFA Value in next row       <=20SENSITIVE    * <10,000 COLONIES/mL ACHROMOBACTER DENITRIFICANS  Microscopic Examination     Status: Abnormal   Collection Time: 10/22/14 10:13 AM  Result Value Ref Range Status   WBC, UA 11-30 (A) 0 -  5 /hpf Final   RBC, UA >30 (A) 0 -  2 /hpf Final   Epithelial Cells (non renal) 0-10 0 - 10 /hpf Final   Bacteria, UA Moderate (A) None seen/Few Final    [x]  Treated with Ciprofloxacin 500mg  BID x 10 days, organism resistant to prescribed antimicrobial  Stop taking ciprofloxacin  New antibiotic prescription: Amoxicillin 500mg  PO Q8H x 7 days  ED Provider: Irena Cords PA-C   Reginia Naas 10/27/2014, 8:57 AM Infectious Diseases Pharmacist Phone# [phone]

## 2014-10-29 LAB — SPECIMEN STATUS REPORT

## 2014-10-31 ENCOUNTER — Ambulatory Visit
Admission: RE | Admit: 2014-10-31 | Discharge: 2014-10-31 | Disposition: A | Discharge status: Home / Self Care | Payer: Medicare Other | Source: Ambulatory Visit | Attending: Physical Medicine and Rehabilitation | Admitting: Physical Medicine and Rehabilitation

## 2014-10-31 DIAGNOSIS — M2578 Osteophyte, vertebrae: Secondary | ICD-10-CM | POA: Diagnosis not present

## 2014-10-31 DIAGNOSIS — M5416 Radiculopathy, lumbar region: Secondary | ICD-10-CM | POA: Insufficient documentation

## 2014-10-31 DIAGNOSIS — M5417 Radiculopathy, lumbosacral region: Secondary | ICD-10-CM

## 2014-11-13 DIAGNOSIS — M48061 Spinal stenosis, lumbar region without neurogenic claudication: Secondary | ICD-10-CM | POA: Insufficient documentation

## 2015-01-02 ENCOUNTER — Other Ambulatory Visit: Payer: Self-pay

## 2015-01-11 ENCOUNTER — Encounter: Payer: Self-pay | Admitting: *Deleted

## 2015-01-11 ENCOUNTER — Encounter: Payer: Self-pay | Admitting: Obstetrics and Gynecology

## 2015-01-11 ENCOUNTER — Ambulatory Visit: Payer: Self-pay | Admitting: Urology

## 2015-01-11 ENCOUNTER — Ambulatory Visit: Payer: Medicare Other | Admitting: Obstetrics and Gynecology

## 2015-01-17 ENCOUNTER — Other Ambulatory Visit: Payer: Self-pay | Admitting: Neurosurgery

## 2015-01-17 DIAGNOSIS — M5416 Radiculopathy, lumbar region: Secondary | ICD-10-CM

## 2015-01-23 ENCOUNTER — Ambulatory Visit
Admission: RE | Admit: 2015-01-23 | Discharge: 2015-01-23 | Disposition: A | Discharge status: Home / Self Care | Payer: Medicare Other | Source: Ambulatory Visit | Attending: Neurosurgery | Admitting: Neurosurgery

## 2015-01-23 VITALS — BP 151/58 | HR 59

## 2015-01-23 DIAGNOSIS — M5416 Radiculopathy, lumbar region: Secondary | ICD-10-CM

## 2015-01-23 DIAGNOSIS — M5116 Intervertebral disc disorders with radiculopathy, lumbar region: Secondary | ICD-10-CM

## 2015-01-23 DIAGNOSIS — M5136 Other intervertebral disc degeneration, lumbar region: Secondary | ICD-10-CM

## 2015-01-23 MED ORDER — DIAZEPAM 5 MG PO TABS
5.0000 mg | ORAL_TABLET | Freq: Once | ORAL | Status: AC
  Administered 2015-01-23: 5 mg via ORAL

## 2015-01-23 MED ORDER — IOHEXOL 180 MG/ML  SOLN
15.0000 mL | Freq: Once | INTRAMUSCULAR | Status: DC | PRN
  Administered 2015-01-23: 15 mL via INTRATHECAL

## 2015-01-23 MED ORDER — ONDANSETRON HCL 4 MG/2ML IJ SOLN
4.0000 mg | Freq: Once | INTRAMUSCULAR | Status: AC
  Administered 2015-01-23: 4 mg via INTRAMUSCULAR

## 2015-01-23 MED ORDER — MEPERIDINE HCL 100 MG/ML IJ SOLN
75.0000 mg | Freq: Once | INTRAMUSCULAR | Status: AC
  Administered 2015-01-23: 75 mg via INTRAMUSCULAR

## 2015-01-23 NOTE — Progress Notes (Signed)
Pt states he has been off Effexor for the past 2 days.  Discharge instructions explained to pt.

## 2015-01-23 NOTE — Discharge Instructions (Signed)
Myelogram Discharge Instructions  1. Go home and rest quietly for the next 24 hours.  It is important to lie flat for the next 24 hours.  Get up only to go to the restroom.  You may lie in the bed or on a couch on your back, your stomach, your left side or your right side.  You may have one pillow under your head.  You may have pillows between your knees while you are on your side or under your knees while you are on your back.  2. DO NOT drive today.  Recline the seat as far back as it will go, while still wearing your seat belt, on the way home.  3. You may get up to go to the bathroom as needed.  You may sit up for 10 minutes to eat.  You may resume your normal diet and medications unless otherwise indicated.  Drink lots of extra fluids today and tomorrow.  4. The incidence of headache, nausea, or vomiting is about 5% (one in 20 patients).  If you develop a headache, lie flat and drink plenty of fluids until the headache goes away.  Caffeinated beverages may be helpful.  If you develop severe nausea and vomiting or a headache that does not go away with flat bed rest, call [phone].  5. You may resume normal activities after your 24 hours of bed rest is over; however, do not exert yourself strongly or do any heavy lifting tomorrow. If when you get up you have a headache when standing, go back to bed and force fluids for another 24 hours.  6. Call your physician for a follow-up appointment.  The results of your myelogram will be sent directly to your physician by the following day.  7. If you have any questions or if complications develop after you arrive home, please call [phone].  Discharge instructions have been explained to the patient.  The patient, or the person responsible for the patient, fully understands these instructions.    May resume Effexor on Sept. 22, 2016, after 1:00 pm.

## 2015-03-21 ENCOUNTER — Encounter: Payer: Self-pay | Admitting: Obstetrics and Gynecology

## 2015-03-21 ENCOUNTER — Ambulatory Visit (INDEPENDENT_AMBULATORY_CARE_PROVIDER_SITE_OTHER): Payer: Medicare Other | Admitting: Obstetrics and Gynecology

## 2015-03-21 ENCOUNTER — Telehealth: Payer: Self-pay

## 2015-03-21 VITALS — BP 151/87 | HR 60 | Resp 16 | Ht 69.0 in | Wt 205.1 lb

## 2015-03-21 DIAGNOSIS — R361 Hematospermia: Secondary | ICD-10-CM | POA: Diagnosis not present

## 2015-03-21 DIAGNOSIS — R310 Gross hematuria: Secondary | ICD-10-CM | POA: Diagnosis not present

## 2015-03-21 LAB — URINALYSIS, COMPLETE
BILIRUBIN UA: NEGATIVE
GLUCOSE, UA: NEGATIVE
Nitrite, UA: NEGATIVE
Specific Gravity, UA: 1.025 (ref 1.005–1.030)
UUROB: 0.2 mg/dL (ref 0.2–1.0)
pH, UA: 6 (ref 5.0–7.5)

## 2015-03-21 LAB — MICROSCOPIC EXAMINATION: RBC MICROSCOPIC, UA: NONE SEEN /HPF (ref 0–?)
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
## 2015-03-21 NOTE — Progress Notes (Signed)
03/21/2015 4:50 PM   Martin Hardy 1945-10-16 [id number]  Referring provider: Adrian Prows, MD [place], [place] [address]  Chief Complaint  Patient presents with  . Hematuria    HPI: Patient is a 69 year old male with a history of alcohol abuse and BPH status post KTP laser treatment in June of this year. He presents today with an acute complaint of hematospermia and gross hematuria. He reports one episode of hematospermia and one episode of  painless gross hematuria the following day.    He reports no difficulty urinating since KTP laser treatment and after decreasing his  alcohol consumption.   No history of renal stones.  No flank pain, dysuria or fevers.  No other urinary symptoms.  No smoking history.  No chemical exposures.   PMH: Past Medical History  Diagnosis Date  . Hypertension   . Dysrhythmia   . AICD (automatic cardioverter/defibrillator) present   . BPH (benign prostatic hyperplasia)   . Obesity   . Depression   . Cancer ([place])     rectal  . Cardiomyopathy ([place])   . PUD (peptic ulcer disease)   . Alcohol abuse   . Presence of permanent cardiac pacemaker   . Enlarged heart   . Sleep apnea     cpap  . CHF (congestive heart failure) (HCC)     CHRONIC  . History of blood transfusion   . Diverticulosis   . History of bleeding peptic ulcer   . Urinary retention   . Scrotal cyst   . Enlarged heart   . Overweight   . Bladder tumor   . Epididymal mass   . Spermatocele     Surgical History: Past Surgical History  Procedure Laterality Date  . Hernia repair    . Foot neuroma surgery    . Vagotomy    . Ep implantable device    . Green light laser turp (transurethral resection of prostate N/A 09/24/2014    Procedure: GREEN LIGHT LASER TURP (TRANSURETHRAL RESECTION OF PROSTATE;  Surgeon: Collier Flowers, MD;  Location: [facility] ORS;  Service: Urology;  Laterality: N/A;    Home Medications:     Medication List       This list is accurate as of: 03/21/15  4:50 PM.  Always use your most recent med list.               aspirin EC 81 MG tablet  Take by mouth.     carvedilol 25 MG tablet  Commonly known as:  COREG  Take 25 mg by mouth 2 (two) times daily with a meal.     hydrochlorothiazide 25 MG tablet  Commonly known as:  HYDRODIURIL  Take 25 mg by mouth daily.     HYDROcodone-acetaminophen 5-325 MG tablet  Commonly known as:  NORCO/VICODIN  Take 1 tablet by mouth every 6 (six) hours as needed for moderate pain.     lisinopril 40 MG tablet  Commonly known as:  PRINIVIL,ZESTRIL  Take 40 mg by mouth daily. pm     omeprazole 20 MG capsule  Commonly known as:  PRILOSEC  Take 20 mg by mouth daily. pm     oxybutynin 5 MG tablet  Commonly known as:  DITROPAN     venlafaxine 75 MG tablet  Commonly known as:  EFFEXOR  Take 75 mg by mouth 2 (two) times daily. pm        Allergies: No Known Allergies  Family History: Family History  Problem Relation Age of Onset  . COPD Mother   . Hypertension Mother     Father  . Prostate cancer Neg Hx     Social History:  reports that he has never smoked. His smokeless tobacco use includes Snuff. He reports that he drinks alcohol. He reports that he does not use illicit drugs.  ROS: UROLOGY Frequent Urination?: No Hard to postpone urination?: No Burning/pain with urination?: No Get up at night to urinate?: No Leakage of urine?: No Urine stream starts and stops?: No Trouble starting stream?: No Do you have to strain to urinate?: No Blood in urine?: Yes Urinary tract infection?: No Sexually transmitted disease?: No Injury to kidneys or bladder?: No Painful intercourse?: No Weak stream?: No Erection problems?: No Penile pain?: No  Gastrointestinal Nausea?: No Vomiting?: No Indigestion/heartburn?: No Diarrhea?: No Constipation?: No  Constitutional Fever: No Night sweats?: No Weight loss?: Yes Fatigue?:  No  Skin Skin rash/lesions?: No Itching?: No  Eyes Blurred vision?: No Double vision?: No  Ears/Nose/Throat Sore throat?: No Sinus problems?: No  Hematologic/Lymphatic Swollen glands?: No Easy bruising?: No  Cardiovascular Leg swelling?: No Chest pain?: No  Respiratory Cough?: No Shortness of breath?: No  Endocrine Excessive thirst?: No  Musculoskeletal Back pain?: No Joint pain?: No  Neurological Headaches?: No Dizziness?: No  Psychologic Depression?: Yes Anxiety?: No  Physical Exam: BP 151/87 mmHg  Pulse 60  Resp 16  Ht 5\' 9"  (1.753 m)  Wt 205 lb 1.6 oz (93.033 kg)  BMI 30.27 kg/m2  Constitutional:  Alert and oriented, No acute distress. HEENT: [place] AT, moist mucus membranes.  Trachea midline, no masses. Cardiovascular: No clubbing, cyanosis, or edema. Respiratory: Normal respiratory effort, no increased work of breathing. GI: Abdomen is soft, nontender, nondistended, no abdominal masses GU: No CVA tenderness, uncircumcised phallus, normal scrotum and testicles.  DRE: prostate +2 and smooth Skin: No rashes, bruises or suspicious lesions. Lymph: No cervical or inguinal adenopathy. Neurologic: Grossly intact, no focal deficits, moving all 4 extremities. Psychiatric: Normal mood and affect.  Laboratory Data:   Urinalysis  Results for orders placed or performed in visit on 03/21/15  Microscopic Examination  Result Value Ref Range   WBC, UA 11-30 (A) 0 -  5 /hpf   RBC, UA None seen 0 -  2 /hpf   Epithelial Cells (non renal) 0-10 0 - 10 /hpf   Mucus, UA Present (A) Not Estab.   Bacteria, UA Few (A) None seen/Few  Urinalysis, Complete  Result Value Ref Range   Specific Gravity, UA 1.025 1.005 - 1.030   pH, UA 6.0 5.0 - 7.5   Color, UA Yellow Yellow   Appearance Ur Clear Clear   Leukocytes, UA Trace (A) Negative   Protein, UA 1+ (A) Negative/Trace   Glucose, UA Negative Negative   Ketones, UA Trace (A) Negative   RBC, UA Trace (A) Negative    Bilirubin, UA Negative Negative   Urobilinogen, Ur 0.2 0.2 - 1.0 mg/dL   Nitrite, UA Negative Negative   Microscopic Examination See below:      Pertinent Imaging:   Assessment & Plan:    1. Gross hematuria- We discussed the differential diagnosis for hematuria including nephrolithiasis, renal or upper tract tumors, bladder stones, UTIs, or bladder tumors as well as undetermined etiologies. Per AUA guidelines, I did recommend complete hematuria evaluation including CTU, possible urine cytology, and office cystoscopy. -CT Urogram, had recent cysto by Dr. [name] prior to KTP laser treatment.  He may need a repeat cysto pending CT findings. - Urinalysis, Complete  2. Hematospermia  Return for CT Urogram results.  These notes generated with voice recognition software. I apologize for typographical errors.  Herbert Moors, [facility] [address] [place], [place] [address] [phone]

## 2015-03-26 ENCOUNTER — Other Ambulatory Visit: Payer: Self-pay | Admitting: Infectious Diseases

## 2015-03-26 DIAGNOSIS — R634 Abnormal weight loss: Secondary | ICD-10-CM

## 2015-03-26 DIAGNOSIS — F1020 Alcohol dependence, uncomplicated: Secondary | ICD-10-CM

## 2015-03-26 DIAGNOSIS — D696 Thrombocytopenia, unspecified: Secondary | ICD-10-CM

## 2015-03-26 DIAGNOSIS — R6881 Early satiety: Secondary | ICD-10-CM

## 2015-04-01 ENCOUNTER — Ambulatory Visit
Admission: RE | Admit: 2015-04-01 | Discharge: 2015-04-01 | Disposition: A | Discharge status: Home / Self Care | Payer: Medicare Other | Source: Ambulatory Visit | Attending: Infectious Diseases | Admitting: Infectious Diseases

## 2015-04-01 DIAGNOSIS — F1020 Alcohol dependence, uncomplicated: Secondary | ICD-10-CM | POA: Diagnosis present

## 2015-04-01 DIAGNOSIS — N400 Enlarged prostate without lower urinary tract symptoms: Secondary | ICD-10-CM | POA: Diagnosis not present

## 2015-04-01 DIAGNOSIS — R6881 Early satiety: Secondary | ICD-10-CM | POA: Diagnosis present

## 2015-04-01 DIAGNOSIS — R634 Abnormal weight loss: Secondary | ICD-10-CM | POA: Insufficient documentation

## 2015-04-01 DIAGNOSIS — K5730 Diverticulosis of large intestine without perforation or abscess without bleeding: Secondary | ICD-10-CM | POA: Insufficient documentation

## 2015-04-01 DIAGNOSIS — K4020 Bilateral inguinal hernia, without obstruction or gangrene, not specified as recurrent: Secondary | ICD-10-CM | POA: Diagnosis not present

## 2015-04-01 DIAGNOSIS — K760 Fatty (change of) liver, not elsewhere classified: Secondary | ICD-10-CM | POA: Diagnosis not present

## 2015-04-01 DIAGNOSIS — D696 Thrombocytopenia, unspecified: Secondary | ICD-10-CM

## 2015-04-01 MED ORDER — IOHEXOL 300 MG/ML  SOLN
100.0000 mL | Freq: Once | INTRAMUSCULAR | Status: AC | PRN
  Administered 2015-04-01: 100 mL via INTRAVENOUS

## 2015-04-02 ENCOUNTER — Ambulatory Visit: Payer: Medicare Other

## 2015-04-04 ENCOUNTER — Telehealth: Payer: Self-pay | Admitting: Obstetrics and Gynecology

## 2015-04-04 ENCOUNTER — Ambulatory Visit: Payer: Medicare Other | Admitting: Obstetrics and Gynecology

## 2015-04-04 NOTE — Telephone Encounter (Signed)
Spoke with patient concerning him getting the CT abdomen and pelvis with contrast instead of CT urogram ordered for evaluation of gross hematuria. I made patient aware that we could potentially be missing a GU pathology including possible cancers. He states that he does not want any further imaging at this time and that he has been experiencing no further urinary issues. We were then disconnected prior to finishing our conversation. I called and left message with his wife at home for patient to return my call.

## 2015-04-04 NOTE — Telephone Encounter (Signed)
I spoke with the patient and he is agreeable to have a repeat cystoscopy performed within the next few weeks. We please have this scheduled with one of the providers and they can discuss possible need for further imaging studies for gross hematuria at that time as well.  Thanks

## 2015-04-05 NOTE — Telephone Encounter (Signed)
Patient is coming for a cysto on 04-18-15  Thanks, Sharyn Lull

## 2015-04-18 ENCOUNTER — Encounter: Payer: Self-pay | Admitting: Urology

## 2015-04-18 ENCOUNTER — Telehealth: Payer: Self-pay | Admitting: Urology

## 2015-04-18 ENCOUNTER — Ambulatory Visit (INDEPENDENT_AMBULATORY_CARE_PROVIDER_SITE_OTHER): Payer: Medicare Other | Admitting: Urology

## 2015-04-18 VITALS — BP 169/102 | HR 60 | Ht 69.0 in | Wt 213.9 lb

## 2015-04-18 DIAGNOSIS — R310 Gross hematuria: Secondary | ICD-10-CM

## 2015-04-18 DIAGNOSIS — D696 Thrombocytopenia, unspecified: Secondary | ICD-10-CM | POA: Insufficient documentation

## 2015-04-18 DIAGNOSIS — I509 Heart failure, unspecified: Secondary | ICD-10-CM | POA: Insufficient documentation

## 2015-04-18 DIAGNOSIS — F1020 Alcohol dependence, uncomplicated: Secondary | ICD-10-CM | POA: Insufficient documentation

## 2015-04-18 LAB — URINALYSIS, COMPLETE
Bilirubin, UA: NEGATIVE
Glucose, UA: NEGATIVE
KETONES UA: NEGATIVE
LEUKOCYTES UA: NEGATIVE
Nitrite, UA: NEGATIVE
Protein, UA: NEGATIVE
RBC, UA: NEGATIVE
Urobilinogen, Ur: 0.2 mg/dL (ref 0.2–1.0)
pH, UA: 5.5 (ref 5.0–7.5)

## 2015-04-18 LAB — MICROSCOPIC EXAMINATION

## 2015-04-18 NOTE — Progress Notes (Signed)
04/18/2015 3:16 PM   Martin Hardy 1945-08-14 [id number]  Referring provider: Adrian Prows, MD [place], [place] [address]  Chief Complaint  Patient presents with  . Follow-up    gross hematuria    HPI: Patient is a 69 year old male with a history of alcohol abuse and BPH status post KTP laser treatment in June of this year. He presents today with an acute complaint of hematospermia and gross hematuria. He reports one episode of hematospermia and one episode of painless gross hematuria the following day.   He reports no difficulty urinating since KTP laser treatment and after decreasing his alcohol consumption.   No history of renal stones. No flank pain, dysuria or fevers. No other urinary symptoms.  No smoking history. No chemical exposures.   Interval history: The patient presents today for cystoscopy. He did have a CT with contrast performed in November 2016 that was unremarkable. The patient did not undergo a CT urogram. He has not had anymore episodes gross hematuria. He is refusing any further workup of this time.   PMH: Past Medical History  Diagnosis Date  . Hypertension   . Dysrhythmia   . AICD (automatic cardioverter/defibrillator) present   . BPH (benign prostatic hyperplasia)   . Obesity   . Depression   . Cardiomyopathy ([place])   . PUD (peptic ulcer disease)   . Alcohol abuse   . Presence of permanent cardiac pacemaker   . Enlarged heart   . Sleep apnea     cpap  . CHF (congestive heart failure) (HCC)     CHRONIC  . History of blood transfusion   . Diverticulosis   . History of bleeding peptic ulcer   . Urinary retention   . Scrotal cyst   . Enlarged heart   . Overweight   . Bladder tumor   . Epididymal mass   . Spermatocele   . Cancer [facility]) 2012    Polyp resected colonoscopy    Surgical History: Past Surgical History  Procedure Laterality Date  . Hernia repair      . Foot neuroma surgery    . Vagotomy    . Ep implantable device    . Green light laser turp (transurethral resection of prostate N/A 09/24/2014    Procedure: GREEN LIGHT LASER TURP (TRANSURETHRAL RESECTION OF PROSTATE;  Surgeon: Collier Flowers, MD;  Location: [facility] ORS;  Service: Urology;  Laterality: N/A;    Home Medications:    Medication List       This list is accurate as of: 04/18/15  3:16 PM.  Always use your most recent med list.               aspirin EC 81 MG tablet  Take by mouth.     carvedilol 25 MG tablet  Commonly known as:  COREG  Take 25 mg by mouth 2 (two) times daily with a meal.     hydrochlorothiazide 25 MG tablet  Commonly known as:  HYDRODIURIL  Take 25 mg by mouth daily.     HYDROcodone-acetaminophen 5-325 MG tablet  Commonly known as:  NORCO/VICODIN  Take 1 tablet by mouth every 6 (six) hours as needed for moderate pain.     lisinopril 40 MG tablet  Commonly known as:  PRINIVIL,ZESTRIL  Take 40 mg by mouth daily. pm     omeprazole 20 MG capsule  Commonly known as:  PRILOSEC  Take 20 mg by mouth daily.  pm     oxybutynin 5 MG tablet  Commonly known as:  DITROPAN     tamsulosin 0.4 MG Caps capsule  Commonly known as:  FLOMAX  TAKE 1 CAPSULE BY MOUTH EVERY DAY     venlafaxine 75 MG tablet  Commonly known as:  EFFEXOR  Take 75 mg by mouth 2 (two) times daily. pm        Allergies: No Known Allergies  Family History: Family History  Problem Relation Age of Onset  . COPD Mother   . Hypertension Mother     Father  . Prostate cancer Neg Hx     Social History:  reports that he has never smoked. His smokeless tobacco use includes Snuff. He reports that he drinks alcohol. He reports that he does not use illicit drugs.  ROS: UROLOGY Frequent Urination?: No Hard to postpone urination?: No Burning/pain with urination?: No Get up at night to urinate?: No Leakage of urine?: No Urine stream starts and stops?: No Trouble starting stream?:  No Do you have to strain to urinate?: No Blood in urine?: No Urinary tract infection?: No Sexually transmitted disease?: No Injury to kidneys or bladder?: No Painful intercourse?: No Weak stream?: No Erection problems?: No Penile pain?: No  Gastrointestinal Nausea?: No Vomiting?: No Indigestion/heartburn?: No Diarrhea?: No Constipation?: No  Constitutional Fever: No Night sweats?: No Weight loss?: No Fatigue?: No  Skin Skin rash/lesions?: No Itching?: No  Eyes Blurred vision?: No Double vision?: No  Ears/Nose/Throat Sore throat?: No Sinus problems?: No  Hematologic/Lymphatic Swollen glands?: No Easy bruising?: No  Cardiovascular Leg swelling?: No Chest pain?: No  Respiratory Cough?: No Shortness of breath?: No  Endocrine Excessive thirst?: No  Musculoskeletal Back pain?: No Joint pain?: No  Neurological Headaches?: No Dizziness?: No  Psychologic Depression?: No Anxiety?: No  Physical Exam: BP 169/102 mmHg  Pulse 60  Ht 5\' 9"  (1.753 m)  Wt 213 lb 14.4 oz (97.024 kg)  BMI 31.57 kg/m2  Constitutional:  Alert and oriented, No acute distress. HEENT: [place] AT, moist mucus membranes.  Trachea midline, no masses. Cardiovascular: No clubbing, cyanosis, or edema. Respiratory: Normal respiratory effort, no increased work of breathing. GI: Abdomen is soft, nontender, nondistended, no abdominal masses GU: No CVA tenderness.  Skin: No rashes, bruises or suspicious lesions. Lymph: No cervical or inguinal adenopathy. Neurologic: Grossly intact, no focal deficits, moving all 4 extremities. Psychiatric: Normal mood and affect.  Laboratory Data: Lab Results  Component Value Date   WBC 8.8 10/21/2014   HGB 15.8 10/21/2014   HCT 46.6 10/21/2014   MCV 89.1 10/21/2014   PLT 129* 10/21/2014    Lab Results  Component Value Date   CREATININE 0.78 10/21/2014    No results found for: PSA  No results found for: TESTOSTERONE  No results found for:  HGBA1C  Urinalysis    Component Value Date/Time   COLORURINE RED* 10/21/2014 1907   COLORURINE Straw 06/01/2014 1944   APPEARANCEUR CLOUDY 10/21/2014 1907   APPEARANCEUR Clear 06/01/2014 1944   LABSPEC 1.009 10/21/2014 1907   LABSPEC 1.003 06/01/2014 1944   PHURINE SEE COMMENT 10/21/2014 1907   PHURINE 6.0 06/01/2014 1944   GLUCOSEU Negative 03/21/2015 1057   GLUCOSEU Negative 06/01/2014 1944   HGBUR SEE COMMENT 10/21/2014 1907   HGBUR 3+ 06/01/2014 1944   BILIRUBINUR Negative 03/21/2015 1057   BILIRUBINUR SEE COMMENT 10/21/2014 1907   BILIRUBINUR Negative 06/01/2014 1944   KETONESUR SEE COMMENT 10/21/2014 [place] Negative 06/01/2014 1944   PROTEINUR  SEE COMMENT 10/21/2014 1907   PROTEINUR Negative 06/01/2014 1944   NITRITE Negative 03/21/2015 1057   NITRITE SEE COMMENT 10/21/2014 1907   NITRITE Negative 06/01/2014 1944   LEUKOCYTESUR Trace* 03/21/2015 1057   LEUKOCYTESUR SEE COMMENT 10/21/2014 1907   LEUKOCYTESUR Negative 06/01/2014 1944       Assessment & Plan:    1. Gross hematuria  I went over in great detail the necessary steps of a gross hematuria workup with the patient including CT urogram and cystoscopy. I made aware of these are the recommendations American urological Association. At this time, the patient is no interested in any further testing and does not want to have any further workup of his hematuria. He is agreeable to following up in 3 months and we can again discuss this matter.   2.  BPH with LUTS -improved s/p KTP laser. No further treatment necessary at this time.  Return in about 3 months (around 07/17/2015).  Nickie Retort, MD  [facility] [address], [place], [place] [address] [phone]

## 2015-05-08 NOTE — Telephone Encounter (Signed)
Change pharmacy

## 2015-05-17 ENCOUNTER — Other Ambulatory Visit: Payer: Self-pay | Admitting: Surgery

## 2015-05-17 DIAGNOSIS — M7582 Other shoulder lesions, left shoulder: Secondary | ICD-10-CM

## 2015-05-17 DIAGNOSIS — M75122 Complete rotator cuff tear or rupture of left shoulder, not specified as traumatic: Secondary | ICD-10-CM

## 2015-05-27 ENCOUNTER — Ambulatory Visit
Admission: RE | Admit: 2015-05-27 | Discharge: 2015-05-27 | Disposition: A | Discharge status: Home / Self Care | Payer: Medicare Other | Source: Ambulatory Visit | Attending: Surgery | Admitting: Surgery

## 2015-05-27 DIAGNOSIS — M75122 Complete rotator cuff tear or rupture of left shoulder, not specified as traumatic: Secondary | ICD-10-CM | POA: Diagnosis present

## 2015-05-27 DIAGNOSIS — M7582 Other shoulder lesions, left shoulder: Secondary | ICD-10-CM

## 2015-05-27 DIAGNOSIS — M19012 Primary osteoarthritis, left shoulder: Secondary | ICD-10-CM | POA: Insufficient documentation

## 2015-05-27 MED ORDER — IOHEXOL 300 MG/ML  SOLN
15.0000 mL | Freq: Once | INTRAMUSCULAR | Status: DC | PRN
Start: 2015-05-27 — End: 2015-05-28

## 2015-06-11 ENCOUNTER — Encounter
Admission: RE | Admit: 2015-06-11 | Discharge: 2015-06-11 | Disposition: A | Discharge status: Home / Self Care | Payer: Medicare Other | Source: Ambulatory Visit | Attending: Surgery | Admitting: Surgery

## 2015-06-11 DIAGNOSIS — D696 Thrombocytopenia, unspecified: Secondary | ICD-10-CM | POA: Diagnosis not present

## 2015-06-11 DIAGNOSIS — K760 Fatty (change of) liver, not elsewhere classified: Secondary | ICD-10-CM | POA: Diagnosis not present

## 2015-06-11 DIAGNOSIS — Z79899 Other long term (current) drug therapy: Secondary | ICD-10-CM | POA: Diagnosis not present

## 2015-06-11 DIAGNOSIS — M792 Neuralgia and neuritis, unspecified: Secondary | ICD-10-CM | POA: Diagnosis not present

## 2015-06-11 DIAGNOSIS — I10 Essential (primary) hypertension: Secondary | ICD-10-CM | POA: Diagnosis not present

## 2015-06-11 DIAGNOSIS — F329 Major depressive disorder, single episode, unspecified: Secondary | ICD-10-CM | POA: Diagnosis not present

## 2015-06-11 DIAGNOSIS — F1020 Alcohol dependence, uncomplicated: Secondary | ICD-10-CM | POA: Diagnosis not present

## 2015-06-11 DIAGNOSIS — Z7982 Long term (current) use of aspirin: Secondary | ICD-10-CM | POA: Diagnosis not present

## 2015-06-11 DIAGNOSIS — M75122 Complete rotator cuff tear or rupture of left shoulder, not specified as traumatic: Secondary | ICD-10-CM | POA: Diagnosis present

## 2015-06-11 DIAGNOSIS — Z825 Family history of asthma and other chronic lower respiratory diseases: Secondary | ICD-10-CM | POA: Diagnosis not present

## 2015-06-11 DIAGNOSIS — E785 Hyperlipidemia, unspecified: Secondary | ICD-10-CM | POA: Diagnosis not present

## 2015-06-11 DIAGNOSIS — Z9889 Other specified postprocedural states: Secondary | ICD-10-CM | POA: Diagnosis not present

## 2015-06-11 DIAGNOSIS — G4730 Sleep apnea, unspecified: Secondary | ICD-10-CM | POA: Diagnosis not present

## 2015-06-11 DIAGNOSIS — Z8249 Family history of ischemic heart disease and other diseases of the circulatory system: Secondary | ICD-10-CM | POA: Diagnosis not present

## 2015-06-11 DIAGNOSIS — M7542 Impingement syndrome of left shoulder: Secondary | ICD-10-CM | POA: Diagnosis not present

## 2015-06-11 DIAGNOSIS — Z7951 Long term (current) use of inhaled steroids: Secondary | ICD-10-CM | POA: Diagnosis not present

## 2015-06-11 DIAGNOSIS — Z87891 Personal history of nicotine dependence: Secondary | ICD-10-CM | POA: Diagnosis not present

## 2015-06-11 DIAGNOSIS — M109 Gout, unspecified: Secondary | ICD-10-CM | POA: Diagnosis not present

## 2015-06-11 HISTORY — DX: Personal history of Methicillin resistant Staphylococcus aureus infection: Z86.14

## 2015-06-11 LAB — BASIC METABOLIC PANEL
ANION GAP: 8 (ref 5–15)
BUN: 12 mg/dL (ref 6–20)
CALCIUM: 9.3 mg/dL (ref 8.9–10.3)
CHLORIDE: 106 mmol/L (ref 101–111)
CO2: 24 mmol/L (ref 22–32)
CREATININE: 0.97 mg/dL (ref 0.61–1.24)
GFR calc non Af Amer: >60 mL/min (ref >60)
Glucose, Bld: 98 mg/dL (ref 65–99)
Potassium: 3.5 mmol/L (ref 3.5–5.1)
SODIUM: 138 mmol/L (ref 135–145)

## 2015-06-11 LAB — SURGICAL PCR SCREEN
MRSA, PCR: NEGATIVE
STAPHYLOCOCCUS AUREUS: NEGATIVE

## 2015-06-11 LAB — CBC
HCT: 47.9 % (ref 40.0–52.0)
HEMOGLOBIN: 16.1 g/dL (ref 13.0–18.0)
MCH: 28.8 pg (ref 26.0–34.0)
MCHC: 33.6 g/dL (ref 32.0–36.0)
MCV: 85.7 fL (ref 80.0–100.0)
PLATELETS: 148 10*3/uL — AB (ref 150–440)
RBC: 5.59 MIL/uL (ref 4.40–5.90)
RDW: 14.3 % (ref 11.5–14.5)
WBC: 8.3 10*3/uL (ref 3.8–10.6)

## 2015-06-11 NOTE — Pre-Procedure Instructions (Signed)
Result Narrative                      CARDIOLOGY DEPARTMENT                        [facility]                           [id number]                   [facility]                       Acct #: [id number]         [address] [name], [name] [address]             Date: 03/09/2014 10:08 AM                                                                  Adult Male Age: 70 yrs                     ECHOCARDIOGRAM REPORT                        Outpatient                                                                  KC::[id number]          STUDY:CHEST WALL         TAPE:0000:00: 0:00:00          MD1:  D. FITZGERALD           ECHO:Yes   DOPPLER:Yes  FILE:0000-000-000              BP: 140/78 mmHg          COLOR:Yes  CONTRAST:No       MACHINE:Philips            HR: 65      RV BIOPSY:No         3D:No    SOUND QLTY:Moderate           Height: 70 in         MEDIUM:None                                              Weight: 223 lb  BSA: 2.2 m2  ___________________________________________________________________________________________                  HISTORY:DOE, CHF                   REASON:Assess, LV function  ___________________________________________________________________________________________ ECHOCARDIOGRAPHIC MEASUREMENTS 2D DIMENSIONS AORTA             Values     Normal Range       MAIN PA          Values      Normal Range           Annulus:  nm*      [2.3 - 2.9]                 PA Main:  nm*       [1.5 - 2.1]         Aorta Sin:  nm*      [3.1 - 3.7]        RIGHT VENTRICLE       [place]:  nm*      [2.6 - 3.2]                 RV Base:  nm*       [ < 4.2]         Asc.Aorta:  nm*      [2.6 - 3.4]                  RV Mid:  nm*       [ < 3.5]  LEFT VENTRICLE                                        RV Length:  nm*       [ < 8.6]             LVIDd:  5.7 cm   [4.2 - 5.9]         INFERIOR VENA CAVA             LVIDs:  4.9 cm                              Max. IVC:  nm*       [ <= 2.1]                FS:  13.4 %   [> 25]                     Min. IVC:  nm*               SWT:  1.3 cm   [0.6 - 1.0]                    ------------------               PWT:  1.5 cm   [0.6 - 1.0]                    nm* - not measured  LEFT ATRIUM           LA Diam:  3.7 cm   [3.0 - 4.0]       LA A4C Area:  nm*      [ < 20]  LA Volume:  nm*      [18 - 58]  ___________________________________________________________________________________________ ECHOCARDIOGRAPHIC DESCRIPTIONS  AORTIC ROOT         Size:MODERATELY DILATED   Dissection:INDETERM FOR DISSECTION  AORTIC VALVE     Leaflets:Tricuspid            Morphology:Normal     Mobility:Fully mobile  LEFT VENTRICLE         Size:Normal                 Anterior:HYPOCONTRACTILE  Contraction:MOD GLOBAL DECREASE     Lateral:HYPOCONTRACTILE   Closest EF:35% (Estimated)          Septal:HYPOCONTRACTILE    LV Masses:No Masses                Apical:HYPOCONTRACTILE          [id number] LVH           Inferior:HYPOCONTRACTILE                                    Posterior:HYPOCONTRACTILE Dias.FxClass:N/A  MITRAL VALVE     Leaflets:Normal                 Mobility:Fully mobile   Morphology:ANNULAR CALC  LEFT ATRIUM         Size:Normal                LA Masses:No masses    IA Septum:Normal IAS  MAIN PA         Size:Normal  PULMONIC VALVE   Morphology:Normal                 Mobility:Fully mobile  RIGHT VENTRICLE    RV Masses:No Masses                  Size:MILDLY ENLARGED    Free Wall:Normal              Contraction:Normal  TRICUSPID VALVE     Leaflets:Normal                 Mobility:Fully mobile   Morphology:Normal  RIGHT ATRIUM         Size:MILDLY ENLARGED        RA Other:None      RA Mass:No masses  PERICARDIUM        Fluid:No effusion  INFERIOR VENACAVA         Size:Normal Normal respiratory  collapse   ___________________________________________________________________ DOPPLER ECHO and OTHER SPECIAL PROCEDURES    Aortic:MILD AR                       No AS           154.0 cm/sec peak vel         9.5 mmHg peak grad           4.0 mmHg mean grad            2.2 cm^2 by DOPPLER     Mitral:MILD MR                       No MS           120.0 cm/sec peak vel         5.8 mmHg peak grad           2.0 mmHg mean grad  1.5 cm^2 by DOPPLER           MV Inflow E Vel=77.0 cm/sec   MV Annulus E'Vel=nm*           E/E'Ratio=nm*  Tricuspid:MODERATE TR                   No TS           296.0 cm/sec peak TR vel      45.0 mmHg peak RV pressure  Pulmonary:MILD PR                       No PS    ___________________________________________________________________________________________  INTERPRETATION MODERATE LV SYSTOLIC DYSFUNCTION (See above) NORMAL RIGHT VENTRICULAR SYSTOLIC FUNCTION MODERATE VALVULAR REGURGITATION (See above) NO VALVULAR STENOSIS  ___________________________________________________________________________________________  Electronically signed by: MD Miquel Dunn on 03/09/2014 11:38 AM             Performed By: Rennis Chris, RDCS, RVT       Ordering Physician: [name]

## 2015-06-11 NOTE — Patient Instructions (Addendum)
  Your procedure is scheduled on: Thursday 06/13/15 Report to Day Surgery. 2ND FLOOR MEDICAL MALL ENTRANCE To find out your arrival time please call [phone] between 1PM - 3PM on Wednesday 06/12/15.  Remember: Instructions that are not followed completely may result in serious medical risk, up to and including death, or upon the discretion of your surgeon and anesthesiologist your surgery may need to be rescheduled.    __X__ 1. Do not eat food or drink liquids after midnight. No gum chewing or hard candies.     __X__ 2. No Alcohol for 24 hours before or after surgery.   ____ 3. Bring all medications with you on the day of surgery if instructed.    __X__ 4. Notify your doctor if there is any change in your medical condition     (cold, fever, infections).     Do not wear jewelry, make-up, hairpins, clips or nail polish.  Do not wear lotions, powders, or perfumes.   Do not shave 48 hours prior to surgery. Men may shave face and neck.  Do not bring valuables to the hospital.    [facility] is not responsible for any belongings or valuables.               Contacts, dentures or bridgework may not be worn into surgery.  Leave your suitcase in the car. After surgery it may be brought to your room.  For patients admitted to the hospital, discharge time is determined by your                treatment team.   Patients discharged the day of surgery will not be allowed to drive home.   Please read over the following fact sheets that you were given:   MRSA Information and Surgical Site Infection Prevention   __X__ Take these medicines the morning of surgery with A SIP OF WATER:    1. CARVEDILOL  2. OMEPRAZOLE  3.   4.  5.  6.  ____ Fleet Enema (as directed)   __X__ Use CHG Soap as directed  ____ Use inhalers on the day of surgery  ____ Stop metformin 2 days prior to surgery    ____ Take 1/2 of usual insulin dose the night before surgery and none on the morning of surgery.   __X__  Stop Coumadin/Plavix/aspirin on TODAY  ____ Stop Anti-inflammatories on    ____ Stop supplements until after surgery.    __X__ Bring C-Pap to the hospital.

## 2015-06-13 ENCOUNTER — Encounter: Payer: Self-pay | Admitting: *Deleted

## 2015-06-13 ENCOUNTER — Ambulatory Visit: Payer: Medicare Other | Admitting: Anesthesiology

## 2015-06-13 ENCOUNTER — Encounter
Admission: RE | Disposition: A | Discharge status: Home / Self Care | Payer: Self-pay | Source: Ambulatory Visit | Attending: Surgery

## 2015-06-13 ENCOUNTER — Ambulatory Visit
Admission: RE | Admit: 2015-06-13 | Discharge: 2015-06-13 | Disposition: A | Discharge status: Home / Self Care | Payer: Medicare Other | Source: Ambulatory Visit | Attending: Surgery | Admitting: Surgery

## 2015-06-13 DIAGNOSIS — Z79899 Other long term (current) drug therapy: Secondary | ICD-10-CM | POA: Insufficient documentation

## 2015-06-13 DIAGNOSIS — G4730 Sleep apnea, unspecified: Secondary | ICD-10-CM | POA: Insufficient documentation

## 2015-06-13 DIAGNOSIS — Z7982 Long term (current) use of aspirin: Secondary | ICD-10-CM | POA: Insufficient documentation

## 2015-06-13 DIAGNOSIS — F1020 Alcohol dependence, uncomplicated: Secondary | ICD-10-CM | POA: Insufficient documentation

## 2015-06-13 DIAGNOSIS — M109 Gout, unspecified: Secondary | ICD-10-CM | POA: Insufficient documentation

## 2015-06-13 DIAGNOSIS — Z825 Family history of asthma and other chronic lower respiratory diseases: Secondary | ICD-10-CM | POA: Insufficient documentation

## 2015-06-13 DIAGNOSIS — F329 Major depressive disorder, single episode, unspecified: Secondary | ICD-10-CM | POA: Insufficient documentation

## 2015-06-13 DIAGNOSIS — Z9889 Other specified postprocedural states: Secondary | ICD-10-CM | POA: Insufficient documentation

## 2015-06-13 DIAGNOSIS — M75122 Complete rotator cuff tear or rupture of left shoulder, not specified as traumatic: Secondary | ICD-10-CM | POA: Diagnosis not present

## 2015-06-13 DIAGNOSIS — E785 Hyperlipidemia, unspecified: Secondary | ICD-10-CM | POA: Insufficient documentation

## 2015-06-13 DIAGNOSIS — M7542 Impingement syndrome of left shoulder: Secondary | ICD-10-CM | POA: Insufficient documentation

## 2015-06-13 DIAGNOSIS — I10 Essential (primary) hypertension: Secondary | ICD-10-CM | POA: Insufficient documentation

## 2015-06-13 DIAGNOSIS — K760 Fatty (change of) liver, not elsewhere classified: Secondary | ICD-10-CM | POA: Insufficient documentation

## 2015-06-13 DIAGNOSIS — D696 Thrombocytopenia, unspecified: Secondary | ICD-10-CM | POA: Insufficient documentation

## 2015-06-13 DIAGNOSIS — Z87891 Personal history of nicotine dependence: Secondary | ICD-10-CM | POA: Insufficient documentation

## 2015-06-13 DIAGNOSIS — M792 Neuralgia and neuritis, unspecified: Secondary | ICD-10-CM | POA: Insufficient documentation

## 2015-06-13 DIAGNOSIS — Z7951 Long term (current) use of inhaled steroids: Secondary | ICD-10-CM | POA: Insufficient documentation

## 2015-06-13 DIAGNOSIS — Z8249 Family history of ischemic heart disease and other diseases of the circulatory system: Secondary | ICD-10-CM | POA: Insufficient documentation

## 2015-06-13 HISTORY — PX: SHOULDER ARTHROSCOPY WITH OPEN ROTATOR CUFF REPAIR: SHX6092

## 2015-06-13 SURGERY — ARTHROSCOPY, SHOULDER WITH REPAIR, ROTATOR CUFF, OPEN
Anesthesia: General | Site: Shoulder | Laterality: Left | Wound class: Clean

## 2015-06-13 MED ORDER — PROPOFOL 10 MG/ML IV BOLUS
INTRAVENOUS | Status: DC | PRN
  Administered 2015-06-13: 150 mg via INTRAVENOUS

## 2015-06-13 MED ORDER — OXYCODONE HCL 10 MG PO TABS: 10.0000 mg | ORAL_TABLET | ORAL | Status: DC | PRN

## 2015-06-13 MED ORDER — ONDANSETRON HCL 4 MG/2ML IJ SOLN: 4.0000 mg | Freq: Once | INTRAMUSCULAR | Status: DC | PRN

## 2015-06-13 MED ORDER — PHENYLEPHRINE HCL 10 MG/ML IJ SOLN
INTRAMUSCULAR | Status: DC | PRN
  Administered 2015-06-13: 200 ug via INTRAVENOUS

## 2015-06-13 MED ORDER — LIDOCAINE HCL (PF) 1 % IJ SOLN
INTRAMUSCULAR | Status: AC
  Administered 2015-06-13: .5 mL via SUBCUTANEOUS
  Filled 2015-06-13: qty 5

## 2015-06-13 MED ORDER — MIDAZOLAM HCL 5 MG/5ML IJ SOLN
2.0000 mg | Freq: Once | INTRAMUSCULAR | Status: AC
  Administered 2015-06-13: 2 mg via INTRAVENOUS

## 2015-06-13 MED ORDER — FENTANYL CITRATE (PF) 100 MCG/2ML IJ SOLN
50.0000 ug | Freq: Once | INTRAMUSCULAR | Status: AC
Start: 2015-06-13 — End: 2015-06-13
  Administered 2015-06-13: 50 ug via INTRAVENOUS

## 2015-06-13 MED ORDER — CEFAZOLIN SODIUM-DEXTROSE 2-3 GM-% IV SOLR
2.0000 g | Freq: Once | INTRAVENOUS | Status: AC
  Administered 2015-06-13: 2 g via INTRAVENOUS

## 2015-06-13 MED ORDER — ONDANSETRON HCL 4 MG/2ML IJ SOLN
INTRAMUSCULAR | Status: DC | PRN
  Administered 2015-06-13: 4 mg via INTRAVENOUS

## 2015-06-13 MED ORDER — BUPIVACAINE-EPINEPHRINE 0.5% -1:200000 IJ SOLN
INTRAMUSCULAR | Status: DC | PRN
  Administered 2015-06-13: 25 mL

## 2015-06-13 MED ORDER — PHENYLEPHRINE HCL 10 MG/ML IJ SOLN
10.0000 mg | INTRAVENOUS | Status: DC | PRN
  Administered 2015-06-13: 30 ug/min via INTRAVENOUS

## 2015-06-13 MED ORDER — CEFAZOLIN SODIUM-DEXTROSE 2-3 GM-% IV SOLR
INTRAVENOUS | Status: AC
  Filled 2015-06-13: qty 50

## 2015-06-13 MED ORDER — SUGAMMADEX SODIUM 200 MG/2ML IV SOLN
INTRAVENOUS | Status: DC | PRN
  Administered 2015-06-13: 195 mg via INTRAVENOUS

## 2015-06-13 MED ORDER — LIDOCAINE HCL (CARDIAC) 20 MG/ML IV SOLN
INTRAVENOUS | Status: DC | PRN
  Administered 2015-06-13: 100 mg via INTRAVENOUS

## 2015-06-13 MED ORDER — FENTANYL CITRATE (PF) 100 MCG/2ML IJ SOLN
INTRAMUSCULAR | Status: AC
  Administered 2015-06-13: 50 ug via INTRAVENOUS
  Filled 2015-06-13: qty 2

## 2015-06-13 MED ORDER — BUPIVACAINE-EPINEPHRINE (PF) 0.5% -1:200000 IJ SOLN
INTRAMUSCULAR | Status: AC
  Filled 2015-06-13: qty 30

## 2015-06-13 MED ORDER — ROPIVACAINE HCL 5 MG/ML IJ SOLN
INTRAMUSCULAR | Status: AC
  Administered 2015-06-13: 30 mL via PERINEURAL
  Filled 2015-06-13: qty 40

## 2015-06-13 MED ORDER — EPINEPHRINE HCL 1 MG/ML IJ SOLN
INTRAMUSCULAR | Status: AC
Start: 2015-06-13 — End: 2015-06-13
  Administered 2015-06-13: .15 mL via INTRAVENOUS
  Filled 2015-06-13: qty 1

## 2015-06-13 MED ORDER — EPINEPHRINE HCL 1 MG/ML IJ SOLN
INTRAMUSCULAR | Status: DC | PRN
  Administered 2015-06-13: 2 mL

## 2015-06-13 MED ORDER — EPINEPHRINE HCL 1 MG/ML IJ SOLN
INTRAMUSCULAR | Status: AC
  Filled 2015-06-13: qty 2

## 2015-06-13 MED ORDER — FENTANYL CITRATE (PF) 100 MCG/2ML IJ SOLN: 25.0000 ug | INTRAMUSCULAR | Status: DC | PRN

## 2015-06-13 MED ORDER — ROCURONIUM BROMIDE 100 MG/10ML IV SOLN
INTRAVENOUS | Status: DC | PRN
  Administered 2015-06-13: 50 mg via INTRAVENOUS

## 2015-06-13 MED ORDER — MIDAZOLAM HCL 5 MG/5ML IJ SOLN
INTRAMUSCULAR | Status: AC
  Administered 2015-06-13: 2 mg via INTRAVENOUS
  Filled 2015-06-13: qty 5

## 2015-06-13 MED ORDER — SUCCINYLCHOLINE CHLORIDE 20 MG/ML IJ SOLN
INTRAMUSCULAR | Status: DC | PRN
  Administered 2015-06-13: 100 mg via INTRAVENOUS

## 2015-06-13 MED ORDER — LACTATED RINGERS IV SOLN
INTRAVENOUS | Status: DC
  Administered 2015-06-13: 08:00:00 via INTRAVENOUS

## 2015-06-13 MED ORDER — FENTANYL CITRATE (PF) 100 MCG/2ML IJ SOLN
INTRAMUSCULAR | Status: DC | PRN
  Administered 2015-06-13: 150 ug via INTRAVENOUS
  Administered 2015-06-13: 100 ug via INTRAVENOUS

## 2015-06-13 SURGICAL SUPPLY — 51 items
ANCHOR JUGGERKNOT WTAP NDL 2.9 (Anchor) ×4 IMPLANT
ANCHOR SUT QUATTRO KNTLS 4.5 (Anchor) ×4 IMPLANT
BIT DRILL JUGRKNT W/NDL BIT2.9 (DRILL) ×1 IMPLANT
BLADE FULL RADIUS 3.5 (BLADE) ×2 IMPLANT
BLADE SHAVER 4.5X7 STR FR (MISCELLANEOUS) IMPLANT
BUR ACROMIONIZER 4.0 (BURR) ×2 IMPLANT
BUR BR 5.5 WIDE MOUTH (BURR) IMPLANT
CANNULA 8.5X75 THRED (CANNULA) ×2 IMPLANT
CANNULA SHAVER 8MMX76MM (CANNULA) ×2 IMPLANT
CHLORAPREP W/TINT 26ML (MISCELLANEOUS) ×4 IMPLANT
CORD BIP STRL DISP 12FT (MISCELLANEOUS) ×2 IMPLANT
COVER MAYO STAND STRL (DRAPES) ×2 IMPLANT
DECANTER SPIKE VIAL GLASS SM (MISCELLANEOUS) ×2 IMPLANT
DRAPE IMP U-DRAPE 54X76 (DRAPES) ×2 IMPLANT
DRAPE SURG 17X11 SM STRL (DRAPES) ×2 IMPLANT
DRILL JUGGERKNOT W/NDL BIT 2.9 (DRILL) ×2
DRSG OPSITE POSTOP 4X8 (GAUZE/BANDAGES/DRESSINGS) ×2 IMPLANT
ELECT REM PT RETURN 9FT ADLT (ELECTROSURGICAL) ×2
ELECTRODE REM PT RTRN 9FT ADLT (ELECTROSURGICAL) ×1 IMPLANT
FORCEPS JEWEL BIP 4-3/4 STR (INSTRUMENTS) ×2 IMPLANT
GAUZE PETRO XEROFOAM 1X8 (MISCELLANEOUS) ×2 IMPLANT
GAUZE SPONGE 4X4 12PLY STRL (GAUZE/BANDAGES/DRESSINGS) ×2 IMPLANT
GLOVE BIO SURGEON STRL SZ7.5 (GLOVE) ×4 IMPLANT
GLOVE BIO SURGEON STRL SZ8 (GLOVE) ×4 IMPLANT
GLOVE BIOGEL PI IND STRL 8 (GLOVE) ×1 IMPLANT
GLOVE BIOGEL PI INDICATOR 8 (GLOVE) ×1
GLOVE INDICATOR 8.0 STRL GRN (GLOVE) ×2 IMPLANT
GOWN STRL REUS W/ TWL LRG LVL3 (GOWN DISPOSABLE) ×2 IMPLANT
GOWN STRL REUS W/ TWL XL LVL3 (GOWN DISPOSABLE) ×1 IMPLANT
GOWN STRL REUS W/TWL LRG LVL3 (GOWN DISPOSABLE) ×2
GOWN STRL REUS W/TWL XL LVL3 (GOWN DISPOSABLE) ×1
GRASPER SUT 15 45D LOW PRO (SUTURE) IMPLANT
IV LACTATED RINGER IRRG 3000ML (IV SOLUTION) ×2
IV LR IRRIG 3000ML ARTHROMATIC (IV SOLUTION) ×2 IMPLANT
MANIFOLD NEPTUNE II (INSTRUMENTS) ×2 IMPLANT
MASK FACE SPIDER DISP (MASK) ×2 IMPLANT
MAT BLUE FLOOR 46X72 FLO (MISCELLANEOUS) ×2 IMPLANT
NEEDLE REVERSE CUT 1/2 CRC (NEEDLE) IMPLANT
PACK ARTHROSCOPY SHOULDER (MISCELLANEOUS) ×2 IMPLANT
SLING ARM LRG DEEP (SOFTGOODS) IMPLANT
SLING ULTRA II LG (MISCELLANEOUS) IMPLANT
STAPLER SKIN PROX 35W (STAPLE) ×2 IMPLANT
STRAP SAFETY BODY (MISCELLANEOUS) ×2 IMPLANT
SUT ETHIBOND 0 MO6 C/R (SUTURE) ×2 IMPLANT
SUT PROLENE 4 0 PS 2 18 (SUTURE) ×2 IMPLANT
SUT VIC AB 2-0 CT1 27 (SUTURE) ×2
SUT VIC AB 2-0 CT1 TAPERPNT 27 (SUTURE) ×2 IMPLANT
TAPE MICROFOAM 4IN (TAPE) ×2 IMPLANT
TUBING ARTHRO INFLOW-ONLY STRL (TUBING) ×2 IMPLANT
TUBING CONNECTING 10 (TUBING) ×2 IMPLANT
WAND HAND CNTRL MULTIVAC 90 (MISCELLANEOUS) ×2 IMPLANT

## 2015-06-13 NOTE — H&P (Signed)
Paper H&P to be scanned into permanent record. H&P reviewed. No changes. 

## 2015-06-13 NOTE — Anesthesia Postprocedure Evaluation (Signed)
Anesthesia Post Note  Patient: Martin Hardy.  Procedure(s) Performed: Procedure(s) (LRB): SHOULDER ARTHROSCOPIC debridement and decompression and repair of rotator cuff tear (Left)  Patient location during evaluation: PACU Anesthesia Type: General Level of consciousness: awake and alert Pain management: pain level controlled Vital Signs Assessment: post-procedure vital signs reviewed and stable Respiratory status: spontaneous breathing and respiratory function stable Cardiovascular status: stable Anesthetic complications: no    Last Vitals:  Filed Vitals:   06/13/15 0854 06/13/15 1054  BP: 207/103 108/77  Pulse: 49 55  Temp:  36.7 C  Resp: 11 12    Last Pain:  Filed Vitals:   06/13/15 1057  PainSc: Asleep                 Martin Hardy

## 2015-06-13 NOTE — Anesthesia Preprocedure Evaluation (Addendum)
Anesthesia Evaluation  Patient identified by MRN, date of birth, ID band Patient awake    Reviewed: Allergy & Precautions, NPO status , Patient's Chart, lab work & pertinent test results  Airway Mallampati: III       Dental   Pulmonary neg pulmonary ROS, sleep apnea and Continuous Positive Airway Pressure Ventilation ,           Cardiovascular hypertension, Pt. on medications and Pt. on home beta blockers + Peripheral Vascular Disease and +CHF  + dysrhythmias Atrial Fibrillation + pacemaker + Cardiac Defibrillator      Neuro/Psych Depression negative neurological ROS  negative psych ROS   GI/Hepatic Neg liver ROS, PUD,   Endo/Other  negative endocrine ROS  Renal/GU negative Renal ROS     Musculoskeletal  (+) Arthritis , Osteoarthritis,    Abdominal   Peds  Hematology   Anesthesia Other Findings   Reproductive/Obstetrics                            Anesthesia Physical Anesthesia Plan  ASA: III  Anesthesia Plan: General   Post-op Pain Management: MAC Combined w/ Regional for Post-op pain   Induction: Intravenous  Airway Management Planned: Oral ETT  Additional Equipment:   Intra-op Plan:   Post-operative Plan:   Informed Consent: I have reviewed the patients History and Physical, chart, labs and discussed the procedure including the risks, benefits and alternatives for the proposed anesthesia with the patient or authorized representative who has indicated his/her understanding and acceptance.     Plan Discussed with:   Anesthesia Plan Comments:        Anesthesia Quick Evaluation

## 2015-06-13 NOTE — Op Note (Signed)
06/13/2015  10:37 AM  Patient:   Martin Hardy.  Pre-Op Diagnosis:   Full-thickness rotator cuff tear, left shoulder.  Postoperative diagnosis: Same with labral fraying, left shoulder.  Procedure: Limited arthroscopic debridement with debridement of labrum, arthroscopic subacromial decompression, and mini-open rotator cuff repair, left shoulder.  Anesthesia: General endotracheal with interscalene block placed preoperatively by the anesthesiologist.  Surgeon:   Pascal Lux, MD  Assistant:   Cameron Proud, PA-C; Amber Race, PA-S  Findings: As above. There was a full-thickness tear involving the insertional fibers of the supraspinatus tendon with minimal retraction. The subscapularis, infraspinatus, teres minor tendons were in satisfactory condition. There was extensive labral fraying anteriorly and superior, as well as moderate synovitis anteriorly and superiorly. The biceps tendon was in satisfactory condition, as were the articular surfaces of both the glenoid and humerus.  Complications: None  Fluids:   800 cc  Estimated blood loss: 10 cc  Tourniquet time: None  Drains: None  Closure: Staples   Brief clinical note: The patient is a 70 year old male with a several month history of left shoulder pain. The patient's symptoms have progressed despite medications, activity modification, etc. The patient's history and examination are consistent with impingement/tendinopathy with a rotator cuff tear. These findings were confirmed by MRI scan. The patient presents at this time for definitive management of these shoulder symptoms.  Procedure: The patient underwent placement of an interscalene block by the anesthesiologist in the preoperative holding area before he was brought into the operating room and lain in the supine position. The patient underwent general endotracheal intubation and anesthesia before being repositioned in the beach chair position using  the beach chair positioner. The left shoulder and upper extremity were prepped with ChloraPrep solution before being draped sterilely. Preoperative antibiotics were administered. A timeout was performed to confirm the proper surgical site before the expected portal sites and incision site were injected with 0.5% Sensorcaine with epinephrine. A posterior portal was created and the glenohumeral joint thoroughly inspected with the findings as described above. An anterior portal was created using an outside-in technique. The labrum and rotator cuff were further probed, again confirming the above-noted findings. The areas of labral fraying anteriorly and superiorly were debrided back to stable margins using the full-radius resector, as were areas of synovitis anteriorly and superiorly. The remaining labrum was probed and found to be intact without detachment from the glenoid. The biceps tendon was brought into the joint and carefully inspected, again with the findings as described above. The ArthroCare wand was inserted and used to obtain hemostasis as well as to "anneal" the labrum superiorly and anteriorly. The instruments were removed from the joint after suctioning the excess fluid.  The camera was repositioned through the posterior portal into the subacromial space. A separate lateral portal was created using an outside-in technique. The 3.5 mm full-radius resector was introduced and used to perform a subtotal bursectomy. The ArthroCare wand was then inserted and used to remove the periosteal tissue off the undersurface of the anterior third of the acromion as well as to recess the coracoacromial ligament from its attachment along the anterior and lateral margins of the acromion. The 4.0 mm acromionizing bur was introduced and used to complete the decompression by removing the undersurface of the anterior third of the acromion. The full radius resector was reintroduced to remove any residual bony debris before the  ArthroCare wand was reintroduced to obtain hemostasis. The instruments were then removed from the subacromial space after suctioning the  excess fluid.  An approximately 5-6 cm incision was made over the anterolateral aspect of the shoulder beginning at the anterolateral corner of the acromion and extending distally in line with the bicipital groove. This incision was carried down through the subcutaneous tissues to expose the deltoid fascia. The raphae between the anterior and middle thirds was identified and this plane developed to provide access into the subacromial space. Additional bursal tissues were debrided sharply using Metzenbaum scissors. The rotator cuff tear was readily identified. The margins were debrided sharply with a #15 blade and the exposed greater tuberosity roughened with a rongeur. The tear was repaired using two Biomet 2.9 mm JuggerKnot anchors. Several of these sutures were then brought back laterally and stabilized with 2 [place] push-lock type anchors to create a two-layer closure. An apparent watertight closure was obtained. Several #0 Ethibond interrupted sutures were placed anteriorly and posteriorly to further reinforce the repair and to minimize any "dog-ears".  The wound was copiously irrigated with sterile saline solution before the deltoid raphae was reapproximated using 2-0 Vicryl interrupted sutures. The subcutaneous tissues were closed in two layers using 2-0 Vicryl interrupted sutures before the skin was closed using staples. The portal sites also were closed using staples. A sterile bulky dressing was applied to the shoulder before the arm was placed into a shoulder immobilizer. The patient was then awakened, extubated, and returned to the recovery room in satisfactory condition after tolerating the procedure well.

## 2015-06-13 NOTE — Anesthesia Procedure Notes (Addendum)
Anesthesia Regional Block:  Interscalene brachial plexus block  Pre-Anesthetic Checklist: ,, timeout performed, Correct Patient, Correct Site, Correct Laterality, Correct Procedure, Correct Position, site marked, Risks and benefits discussed,  Surgical consent,  Pre-op evaluation,  At surgeon's request and post-op pain management  Laterality: Left  Prep: chloraprep       Needles:  Injection technique: Single-shot  Needle Type: Stimulator Needle - 40     Needle Length: 9cm 9 cm Needle Gauge: 20 and 20 G  Needle insertion depth: 5 cm   Additional Needles:  Procedures: ultrasound guided (picture in chart) and nerve stimulator Interscalene brachial plexus block  Nerve Stimulator or Paresthesia:  Response: Biceps, 0.6 mA,   Additional Responses:   Narrative:  Start time: 06/13/2015 8:38 AM End time: 06/13/2015 8:51 AM  Performed by: Personally    Procedure Name: Intubation Date/Time: 06/13/2015 9:07 AM Performed by: Rosaria Ferries, Gerrica Cygan Pre-anesthesia Checklist: Patient identified, Emergency Drugs available, Suction available and Patient being monitored Patient Re-evaluated:Patient Re-evaluated prior to inductionOxygen Delivery Method: Circle system utilized Preoxygenation: Pre-oxygenation with 100% oxygen Intubation Type: IV induction Laryngoscope Size: Mac and 3 Grade View: Grade II Tube type: Oral Tube size: 7.0 mm Airway Equipment and Method: Bougie stylet Placement Confirmation: breath sounds checked- equal and bilateral and positive ETCO2 Secured at: 23 cm Tube secured with: Tape Dental Injury: Teeth and Oropharynx as per pre-operative assessment  Future Recommendations: Recommend- induction with short-acting agent, and alternative techniques readily available

## 2015-06-13 NOTE — Discharge Instructions (Addendum)
Keep dressing dry and intact.  May shower after dressing changed on post-op day #4 (Monday).  Cover staples with Band-Aids after drying off. Apply ice frequently to shoulder. Keep shoulder immobilizer on at all times except may remove for bathing purposes. Follow-up in 10-14 days or as scheduled.    AMBULATORY SURGERY  DISCHARGE INSTRUCTIONS   1) The drugs that you were given will stay in your system until tomorrow so for the next 24 hours you should not:  A) Drive an automobile B) Make any legal decisions C) Drink any alcoholic beverage   2) You may resume regular meals tomorrow.  Today it is better to start with liquids and gradually work up to solid foods.  You may eat anything you prefer, but it is better to start with liquids, then soup and crackers, and gradually work up to solid foods.   3) Please notify your doctor immediately if you have any unusual bleeding, trouble breathing, redness and pain at the surgery site, drainage, fever, or pain not relieved by medication.    4) Additional Instructions:        Please contact your physician with any problems or Same Day Surgery at [phone], Monday through Friday 6 am to 4 pm, or [place] [facility] number at [phone].

## 2015-06-13 NOTE — Transfer of Care (Signed)
Immediate Anesthesia Transfer of Care Note  Patient: Martin Hardy.  Procedure(s) Performed: Procedure(s): SHOULDER ARTHROSCOPIC debridement and decompression and repair of rotator cuff tear (Left)  Patient Location: PACU  Anesthesia Type:General  Level of Consciousness: awake, alert , oriented and patient cooperative  Airway & Oxygen Therapy: Patient Spontanous Breathing and Patient connected to face mask oxygen  Post-op Assessment: Report given to RN and Post -op Vital signs reviewed and stable  Post vital signs: Reviewed and stable  Last Vitals:  Filed Vitals:   06/13/15 0854 06/13/15 1054  BP: 207/103 108/77  Pulse: 49 55  Temp:  36.7 C  Resp: 11 12    Complications: No apparent anesthesia complications

## 2015-06-13 NOTE — OR Nursing (Signed)
Left extremity warm, pink, moves fingers well no swelling noted cap refill wnl

## 2015-06-13 NOTE — OR Nursing (Signed)
Transported to PACU for shoulder block.

## 2015-07-17 ENCOUNTER — Ambulatory Visit: Payer: Medicare Other

## 2015-07-18 ENCOUNTER — Ambulatory Visit: Payer: Medicare Other | Admitting: Urology

## 2015-09-14 IMAGING — CT CT L SPINE W/O CM
3 of 10 series · 10 of 33 positions shown, 12 images · non-contrast
Comparison: Lumbar CT 01/17/2014.  Myelogram CT 08/26/2012.

CLINICAL DATA: Left hip and leg numbness and pain for 1 year. No
acute injury, prior relevant surgery or history of malignancy.
Subsequent encounter.

EXAM:
CT LUMBAR SPINE WITHOUT CONTRAST
TECHNIQUE: Multidetector CT imaging of the lumbar spine was performed without
intravenous contrast administration. Multiplanar CT image
reconstructions were also generated.

[Series 3: l spine soft · axial · 0.29mm/px · z∈[-856,-764]mm · 2 of 140 slices shown, 3 images]
[im 47/140  soft-tissue]
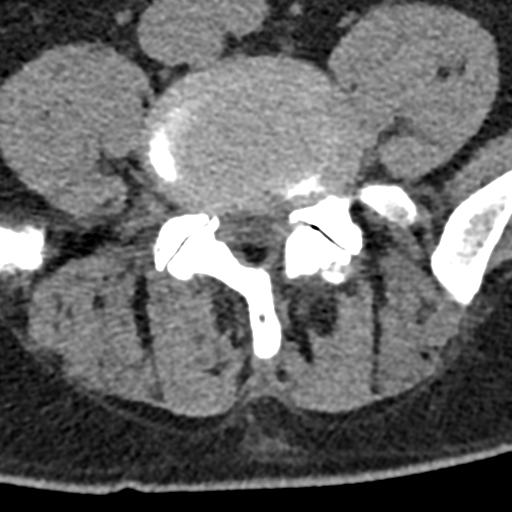
[im 47/140  bone]
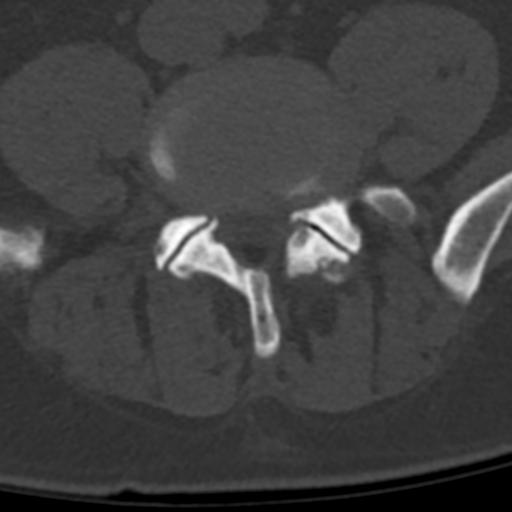
[im 93/140  bone]
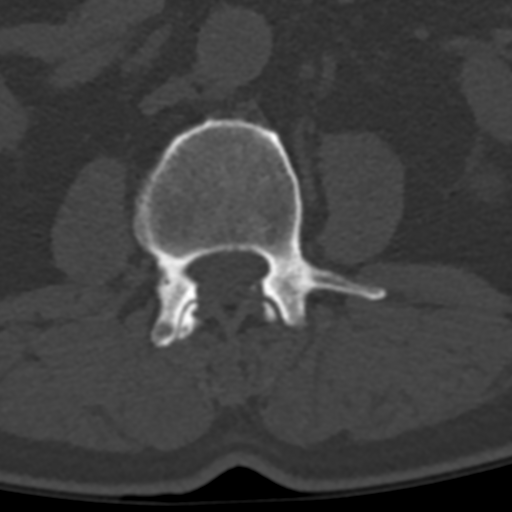

[Series 7: sag bone · sagittal · 0.27mm/px · 5 of 68 slices shown, 6 images]
[im 23/68  bone]
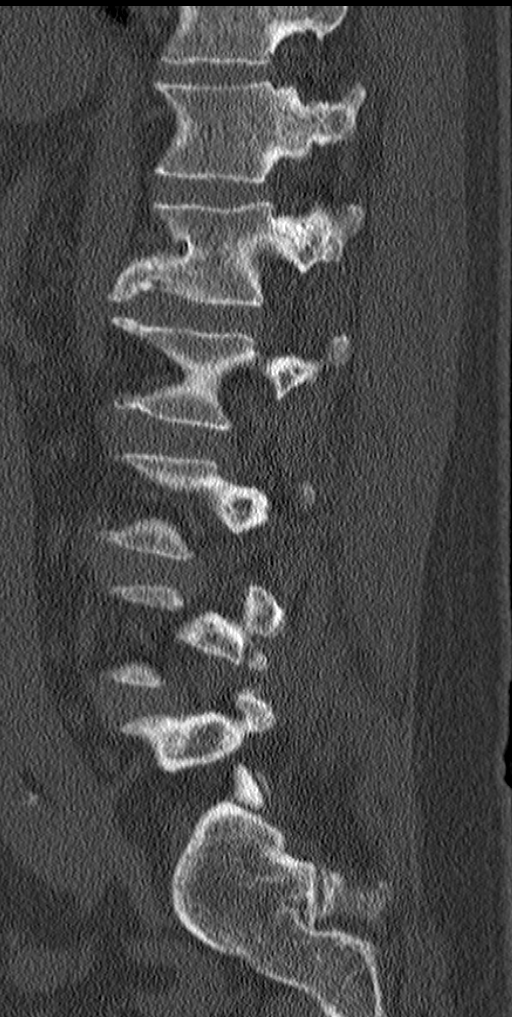
[im 28/68  bone]
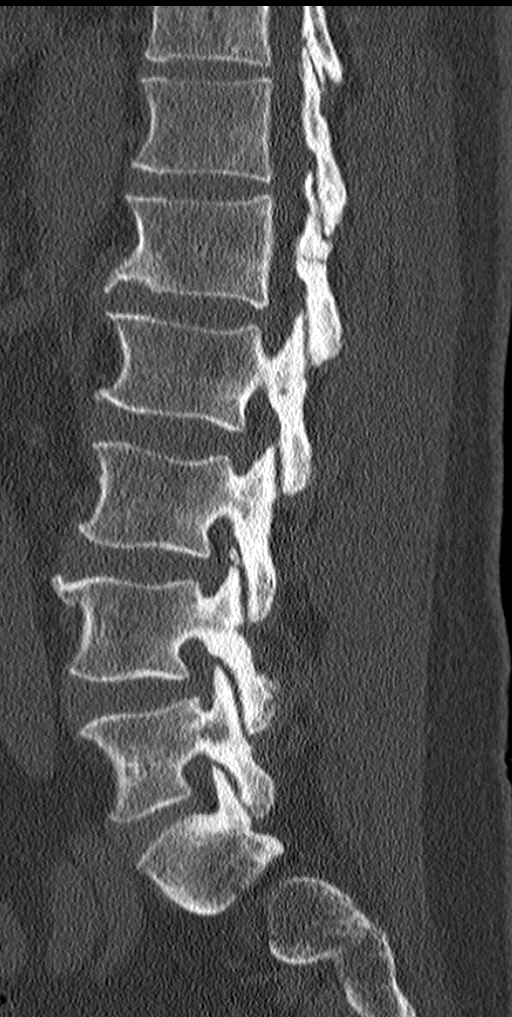
[im 34/68  soft-tissue]
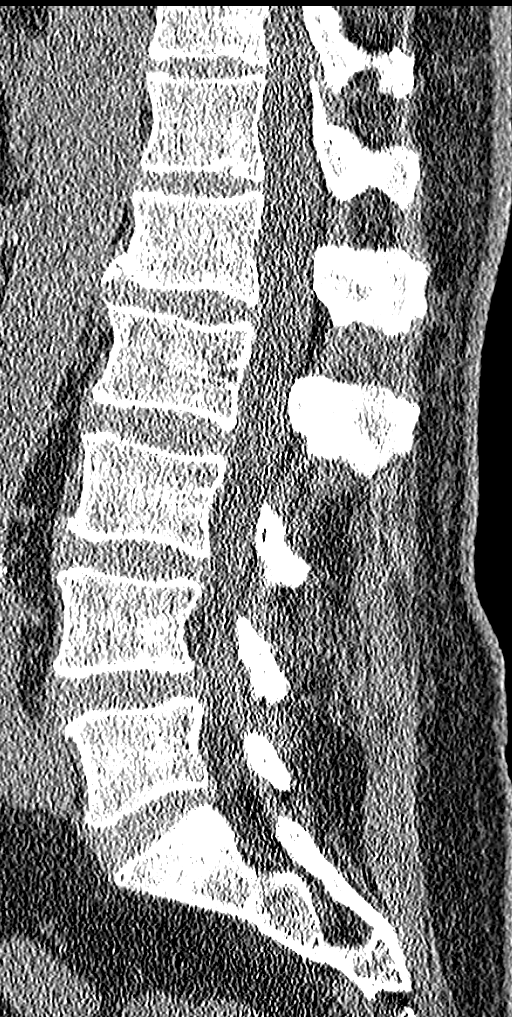
[im 34/68  bone]
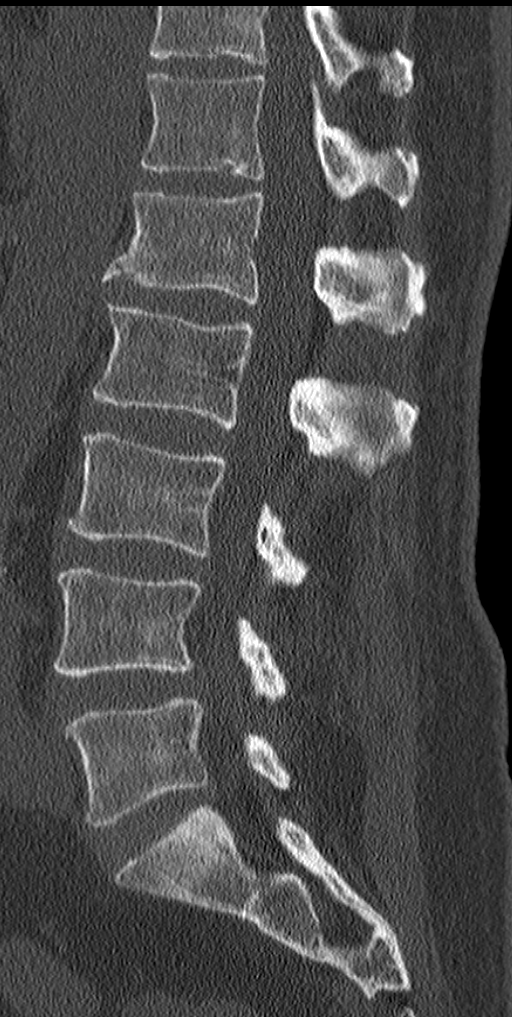
[im 40/68  bone]
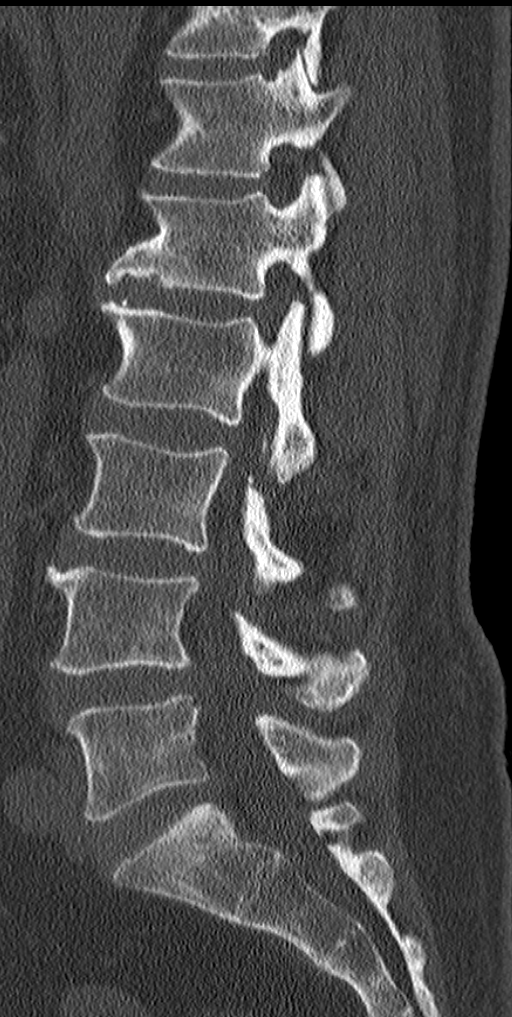
[im 45/68  bone]
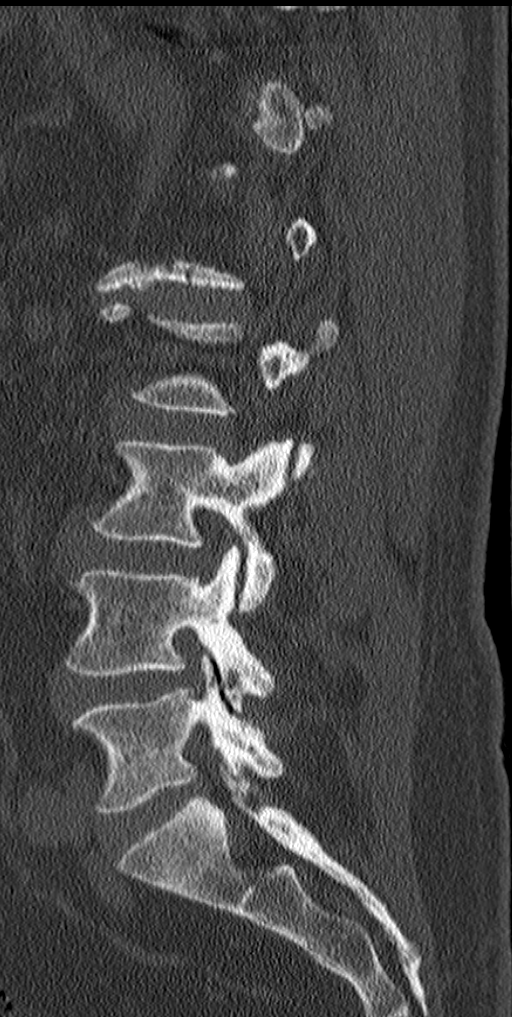

[Series 8: cor bone · coronal · 0.41mm/px · 3 of 79 slices shown]
[im 16/79  bone]
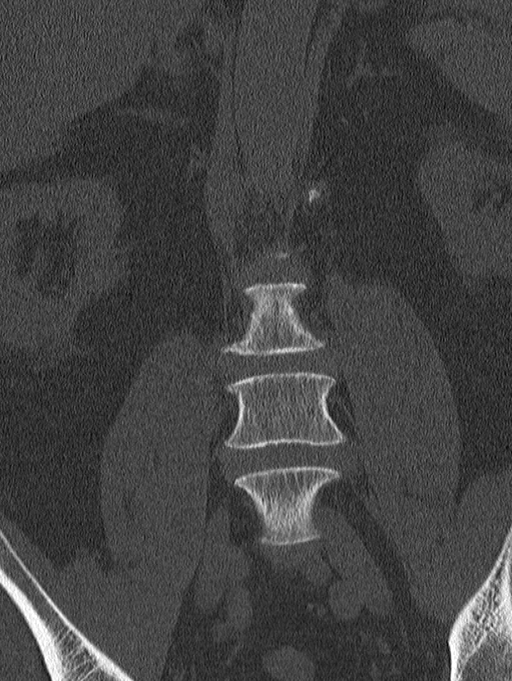
[im 32/79  bone]
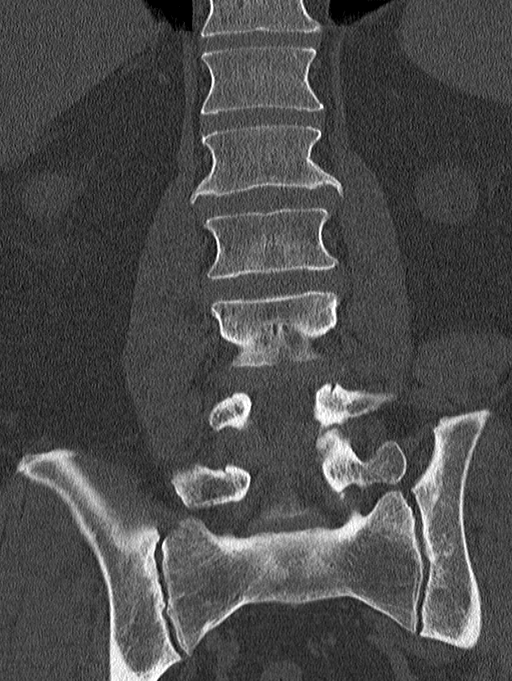
[im 47/79  bone]
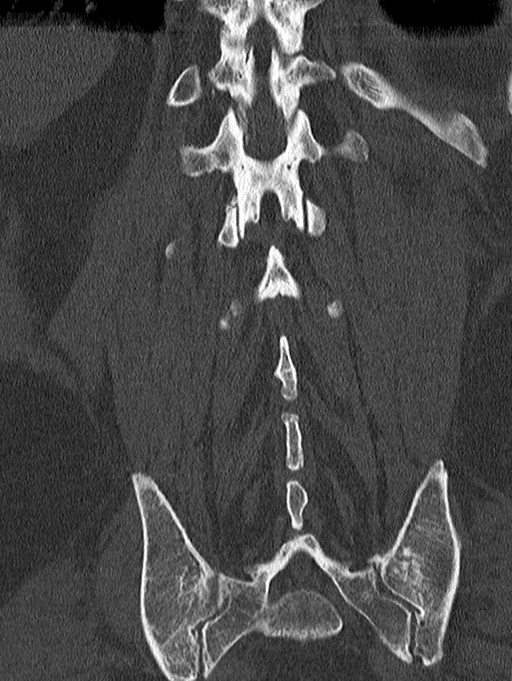

[10 of 33 positions shown; findings below may reference images not displayed]

FINDINGS: AICD noted on the scout image. There are 5 lumbar type vertebral
bodies. The alignment is stable with a minimal anterolisthesis at
L4-5. There is no evidence of acute fracture or pars defect.

Mild aortoiliac atherosclerosis is noted. There is a stable 3 cm
left renal cyst.

L1-2: Stable mild disc bulging with loss of disc height and anterior
osteophytes. No significant spinal stenosis or nerve root
encroachment.

L2-3:  Normal interspace.

L3-4: Mild disc bulging eccentric to the left. Mild facet and
ligamentous hypertrophy. No significant spinal stenosis or nerve
root encroachment.

L4-5: Mild disc bulging asymmetric to the left. There is moderately
advanced facet and ligamentous hypertrophy, also asymmetric to the
left. There is asymmetric soft tissue density medially in the left
foramen with mild resulting osseous erosion. This could reflect a
chronic disc extrusion or synovial cyst associated with the left
facet joint. This could impinge on the left L4 and/or L5 nerve
roots.

L5-S1: Disc height is relatively preserved. Mild bilateral facet
hypertrophy. No significant spinal stenosis or nerve root
encroachment.
IMPRESSION: 1. Persistent asymmetric soft tissue density in the left L4-5
foramen with possible resulting encroachment on the left L4 and/or
L5 nerve roots. This may be secondary to a chronic disc extrusion or
synovial cyst associated with the left facet joint. This could be
further evaluated with repeat myelogram CT if clinically warranted
(presuming the patient's AICD precludes MRI).
2. No other significant spinal stenosis or nerve root encroachment.
Mild disc bulging and anterior osteophytes at L1-2.

## 2015-12-02 ENCOUNTER — Other Ambulatory Visit: Payer: Self-pay | Admitting: Surgery

## 2015-12-02 DIAGNOSIS — M75122 Complete rotator cuff tear or rupture of left shoulder, not specified as traumatic: Secondary | ICD-10-CM

## 2015-12-02 DIAGNOSIS — M7582 Other shoulder lesions, left shoulder: Secondary | ICD-10-CM

## 2015-12-06 ENCOUNTER — Ambulatory Visit
Admission: RE | Admit: 2015-12-06 | Discharge: 2015-12-06 | Disposition: A | Discharge status: Home / Self Care | Payer: Medicare Other | Source: Ambulatory Visit | Attending: Surgery | Admitting: Surgery

## 2015-12-06 DIAGNOSIS — M24112 Other articular cartilage disorders, left shoulder: Secondary | ICD-10-CM | POA: Insufficient documentation

## 2015-12-06 DIAGNOSIS — M75122 Complete rotator cuff tear or rupture of left shoulder, not specified as traumatic: Secondary | ICD-10-CM

## 2015-12-06 DIAGNOSIS — M7582 Other shoulder lesions, left shoulder: Secondary | ICD-10-CM | POA: Insufficient documentation

## 2015-12-06 MED ORDER — LIDOCAINE HCL (PF) 1 % IJ SOLN
10.0000 mL | Freq: Once | INTRAMUSCULAR | Status: AC
  Administered 2015-12-06: 10 mL
  Filled 2015-12-06: qty 10

## 2015-12-06 MED ORDER — IOHEXOL 180 MG/ML  SOLN
25.0000 mL | Freq: Once | INTRAMUSCULAR | Status: AC | PRN
  Administered 2015-12-06: 25 mL via INTRATHECAL

## 2015-12-31 ENCOUNTER — Encounter
Admission: RE | Admit: 2015-12-31 | Discharge: 2015-12-31 | Disposition: A | Discharge status: Home / Self Care | Payer: Medicare Other | Source: Ambulatory Visit | Attending: Surgery | Admitting: Surgery

## 2015-12-31 DIAGNOSIS — Z01810 Encounter for preprocedural cardiovascular examination: Secondary | ICD-10-CM | POA: Insufficient documentation

## 2015-12-31 DIAGNOSIS — Z950 Presence of cardiac pacemaker: Secondary | ICD-10-CM | POA: Diagnosis not present

## 2015-12-31 DIAGNOSIS — M75122 Complete rotator cuff tear or rupture of left shoulder, not specified as traumatic: Secondary | ICD-10-CM | POA: Insufficient documentation

## 2015-12-31 DIAGNOSIS — Z01812 Encounter for preprocedural laboratory examination: Secondary | ICD-10-CM | POA: Insufficient documentation

## 2015-12-31 LAB — CBC
HCT: 46.7 % (ref 40.0–52.0)
HEMOGLOBIN: 16.1 g/dL (ref 13.0–18.0)
MCH: 29.7 pg (ref 26.0–34.0)
MCHC: 34.4 g/dL (ref 32.0–36.0)
MCV: 86.4 fL (ref 80.0–100.0)
PLATELETS: 108 10*3/uL — AB (ref 150–440)
RBC: 5.41 MIL/uL (ref 4.40–5.90)
RDW: 13.7 % (ref 11.5–14.5)
WBC: 7 10*3/uL (ref 3.8–10.6)

## 2015-12-31 LAB — SURGICAL PCR SCREEN
MRSA, PCR: NEGATIVE
Staphylococcus aureus: NEGATIVE

## 2015-12-31 LAB — POTASSIUM: POTASSIUM: 3.3 mmol/L — AB (ref 3.5–5.1)

## 2015-12-31 NOTE — Pre-Procedure Instructions (Signed)
Potassium 3.3 results faxed to office with request for supplement.

## 2015-12-31 NOTE — Patient Instructions (Signed)
  Your procedure is scheduled on: 01/09/16 Thurs Report to Same Day Surgery 2nd floor medical mall To find out your arrival time please call [phone] between 1PM - 3PM on 01/18/16 Wed Remember: Instructions that are not followed completely may result in serious medical risk, up to and including death, or upon the discretion of your surgeon and anesthesiologist your surgery may need to be rescheduled.    _x___ 1. Do not eat food or drink liquids after midnight. No gum chewing or hard candies.     __x__ 2. No Alcohol for 24 hours before or after surgery.   __x__3. No Smoking for 24 prior to surgery.   ____  4. Bring all medications with you on the day of surgery if instructed.    __x__ 5. Notify your doctor if there is any change in your medical condition     (cold, fever, infections).     Do not wear jewelry, make-up, hairpins, clips or nail polish.  Do not wear lotions, powders, or perfumes. You may wear deodorant.  Do not shave 48 hours prior to surgery. Men may shave face and neck.  Do not bring valuables to the hospital.    [facility] is not responsible for any belongings or valuables.               Contacts, dentures or bridgework may not be worn into surgery.  Leave your suitcase in the car. After surgery it may be brought to your room.  For patients admitted to the hospital, discharge time is determined by your treatment team.   Patients discharged the day of surgery will not be allowed to drive home.    Please read over the following fact sheets that you were given:   [facility] Preparing for Surgery and or MRSA Information   _x___ Take these medicines the morning of surgery with A SIP OF WATER:    1. carvedilol (COREG) 25 MG tablet  2.omeprazole (PRILOSEC) 20 MG capsule  3.lisinopril (PRINIVIL,ZESTRIL) 40 MG tablet  4.venlafaxine (EFFEXOR) 75 MG tablet  5.  6.  ____ Fleet Enema (as directed)   _x___ Use CHG Soap or sage wipes as directed on instruction sheet    ____ Use inhalers on the day of surgery and bring to hospital day of surgery  ____ Stop metformin 2 days prior to surgery    ____ Take 1/2 of usual insulin dose the night before surgery and none on the morning of           surgery.   __x__ Stop aspirin or coumadin, or plavix Stop aspirir 1 week before surgery  _x__ Stop Anti-inflammatories such as Advil, Aleve, Ibuprofen, Motrin, Naproxen,          Naprosyn, Goodies powders or aspirin products. Ok to take Tylenol.   ____ Stop supplements until after surgery.    ____ Bring C-Pap to the hospital.

## 2016-01-08 MED ORDER — CEFAZOLIN SODIUM-DEXTROSE 2-4 GM/100ML-% IV SOLN
2.0000 g | Freq: Once | INTRAVENOUS | Status: AC
  Administered 2016-01-09: 2 g via INTRAVENOUS

## 2016-01-09 ENCOUNTER — Ambulatory Visit: Payer: Medicare Other | Admitting: Certified Registered Nurse Anesthetist

## 2016-01-09 ENCOUNTER — Encounter: Payer: Self-pay | Admitting: *Deleted

## 2016-01-09 ENCOUNTER — Ambulatory Visit
Admission: RE | Admit: 2016-01-09 | Discharge: 2016-01-09 | Disposition: A | Discharge status: Home / Self Care | Payer: Medicare Other | Source: Ambulatory Visit | Attending: Surgery | Admitting: Surgery

## 2016-01-09 ENCOUNTER — Encounter
Admission: RE | Disposition: A | Discharge status: Home / Self Care | Payer: Self-pay | Source: Ambulatory Visit | Attending: Surgery

## 2016-01-09 DIAGNOSIS — I110 Hypertensive heart disease with heart failure: Secondary | ICD-10-CM | POA: Insufficient documentation

## 2016-01-09 DIAGNOSIS — F329 Major depressive disorder, single episode, unspecified: Secondary | ICD-10-CM | POA: Insufficient documentation

## 2016-01-09 DIAGNOSIS — D696 Thrombocytopenia, unspecified: Secondary | ICD-10-CM | POA: Diagnosis not present

## 2016-01-09 DIAGNOSIS — M659 Synovitis and tenosynovitis, unspecified: Secondary | ICD-10-CM | POA: Insufficient documentation

## 2016-01-09 DIAGNOSIS — Z85048 Personal history of other malignant neoplasm of rectum, rectosigmoid junction, and anus: Secondary | ICD-10-CM | POA: Diagnosis not present

## 2016-01-09 DIAGNOSIS — Z7982 Long term (current) use of aspirin: Secondary | ICD-10-CM | POA: Insufficient documentation

## 2016-01-09 DIAGNOSIS — Z87891 Personal history of nicotine dependence: Secondary | ICD-10-CM | POA: Diagnosis not present

## 2016-01-09 DIAGNOSIS — M109 Gout, unspecified: Secondary | ICD-10-CM | POA: Diagnosis not present

## 2016-01-09 DIAGNOSIS — Z825 Family history of asthma and other chronic lower respiratory diseases: Secondary | ICD-10-CM | POA: Diagnosis not present

## 2016-01-09 DIAGNOSIS — G4730 Sleep apnea, unspecified: Secondary | ICD-10-CM | POA: Diagnosis not present

## 2016-01-09 DIAGNOSIS — M75102 Unspecified rotator cuff tear or rupture of left shoulder, not specified as traumatic: Secondary | ICD-10-CM | POA: Insufficient documentation

## 2016-01-09 DIAGNOSIS — M5432 Sciatica, left side: Secondary | ICD-10-CM | POA: Insufficient documentation

## 2016-01-09 DIAGNOSIS — I739 Peripheral vascular disease, unspecified: Secondary | ICD-10-CM | POA: Insufficient documentation

## 2016-01-09 DIAGNOSIS — Z8711 Personal history of peptic ulcer disease: Secondary | ICD-10-CM | POA: Insufficient documentation

## 2016-01-09 DIAGNOSIS — I429 Cardiomyopathy, unspecified: Secondary | ICD-10-CM | POA: Diagnosis not present

## 2016-01-09 DIAGNOSIS — I509 Heart failure, unspecified: Secondary | ICD-10-CM | POA: Insufficient documentation

## 2016-01-09 DIAGNOSIS — Z809 Family history of malignant neoplasm, unspecified: Secondary | ICD-10-CM | POA: Diagnosis not present

## 2016-01-09 DIAGNOSIS — Z8249 Family history of ischemic heart disease and other diseases of the circulatory system: Secondary | ICD-10-CM | POA: Diagnosis not present

## 2016-01-09 DIAGNOSIS — I714 Abdominal aortic aneurysm, without rupture: Secondary | ICD-10-CM | POA: Insufficient documentation

## 2016-01-09 DIAGNOSIS — Z79899 Other long term (current) drug therapy: Secondary | ICD-10-CM | POA: Insufficient documentation

## 2016-01-09 DIAGNOSIS — E785 Hyperlipidemia, unspecified: Secondary | ICD-10-CM | POA: Diagnosis not present

## 2016-01-09 HISTORY — PX: SHOULDER ARTHROSCOPY WITH OPEN ROTATOR CUFF REPAIR: SHX6092

## 2016-01-09 LAB — POCT I-STAT 4, (NA,K, GLUC, HGB,HCT)
Glucose, Bld: 128 mg/dL — ABNORMAL HIGH (ref 65–99)
HCT: 52 % (ref 39.0–52.0)
Hemoglobin: 17.7 g/dL — ABNORMAL HIGH (ref 13.0–17.0)
Potassium: 4.1 mmol/L (ref 3.5–5.1)
SODIUM: 141 mmol/L (ref 135–145)

## 2016-01-09 SURGERY — ARTHROSCOPY, SHOULDER WITH REPAIR, ROTATOR CUFF, OPEN
Anesthesia: General | Site: Shoulder | Laterality: Left | Wound class: Clean

## 2016-01-09 MED ORDER — MIDAZOLAM HCL 2 MG/2ML IJ SOLN: 1.0000 mg | Freq: Once | INTRAMUSCULAR | Status: DC

## 2016-01-09 MED ORDER — PHENYLEPHRINE HCL 10 MG/ML IJ SOLN
INTRAMUSCULAR | Status: DC | PRN
  Administered 2016-01-09: 120 ug/min via INTRAVENOUS

## 2016-01-09 MED ORDER — MIDAZOLAM HCL 2 MG/2ML IJ SOLN
1.0000 mg | Freq: Once | INTRAMUSCULAR | Status: AC
  Administered 2016-01-09: 1 mg via INTRAVENOUS

## 2016-01-09 MED ORDER — OXYCODONE HCL 5 MG PO TABS: 5.0000 mg | ORAL_TABLET | ORAL | 0 refills | Status: DC | PRN

## 2016-01-09 MED ORDER — ONDANSETRON HCL 4 MG/2ML IJ SOLN: 4.0000 mg | Freq: Once | INTRAMUSCULAR | Status: DC | PRN

## 2016-01-09 MED ORDER — ONDANSETRON HCL 4 MG PO TABS: 4.0000 mg | ORAL_TABLET | Freq: Four times a day (QID) | ORAL | Status: DC | PRN

## 2016-01-09 MED ORDER — EPINEPHRINE HCL 1 MG/ML IJ SOLN
INTRAMUSCULAR | Status: AC
  Filled 2016-01-09: qty 1

## 2016-01-09 MED ORDER — ROPIVACAINE HCL 5 MG/ML IJ SOLN
INTRAMUSCULAR | Status: AC
  Filled 2016-01-09: qty 40

## 2016-01-09 MED ORDER — FENTANYL CITRATE (PF) 100 MCG/2ML IJ SOLN: 25.0000 ug | INTRAMUSCULAR | Status: DC | PRN

## 2016-01-09 MED ORDER — METOCLOPRAMIDE HCL 5 MG/ML IJ SOLN: 5.0000 mg | Freq: Three times a day (TID) | INTRAMUSCULAR | Status: DC | PRN

## 2016-01-09 MED ORDER — FAMOTIDINE 20 MG PO TABS
ORAL_TABLET | ORAL | Status: AC
  Administered 2016-01-09: 20 mg via ORAL
  Filled 2016-01-09: qty 1

## 2016-01-09 MED ORDER — LABETALOL HCL 5 MG/ML IV SOLN
10.0000 mg | Freq: Once | INTRAVENOUS | Status: AC
  Administered 2016-01-09: 09:00:00 via INTRAVENOUS
  Filled 2016-01-09: qty 4

## 2016-01-09 MED ORDER — MIDAZOLAM HCL 5 MG/5ML IJ SOLN
1.0000 mg | Freq: Once | INTRAMUSCULAR | Status: AC
  Administered 2016-01-09: 1 mg via INTRAVENOUS

## 2016-01-09 MED ORDER — LACTATED RINGERS IV SOLN
INTRAVENOUS | Status: DC
  Administered 2016-01-09: 08:00:00 via INTRAVENOUS

## 2016-01-09 MED ORDER — LABETALOL HCL 5 MG/ML IV SOLN
INTRAVENOUS | Status: AC
  Administered 2016-01-09: 5 mg via INTRAVENOUS
  Filled 2016-01-09: qty 4

## 2016-01-09 MED ORDER — EPINEPHRINE HCL 1 MG/ML IJ SOLN
INTRAMUSCULAR | Status: DC | PRN
  Administered 2016-01-09: 2 mL

## 2016-01-09 MED ORDER — ONDANSETRON HCL 4 MG/2ML IJ SOLN: 4.0000 mg | Freq: Four times a day (QID) | INTRAMUSCULAR | Status: DC | PRN

## 2016-01-09 MED ORDER — POTASSIUM CHLORIDE IN NACL 20-0.9 MEQ/L-% IV SOLN
INTRAVENOUS | Status: DC
  Filled 2016-01-09: qty 1000

## 2016-01-09 MED ORDER — HYDRALAZINE HCL 20 MG/ML IJ SOLN
10.0000 mg | Freq: Once | INTRAMUSCULAR | Status: AC
  Administered 2016-01-09: 10 mg via INTRAVENOUS
  Filled 2016-01-09: qty 0.5

## 2016-01-09 MED ORDER — ROCURONIUM BROMIDE 100 MG/10ML IV SOLN
INTRAVENOUS | Status: DC | PRN
  Administered 2016-01-09: 20 mg via INTRAVENOUS
  Administered 2016-01-09: 50 mg via INTRAVENOUS

## 2016-01-09 MED ORDER — ROPIVACAINE HCL 2 MG/ML IJ SOLN
INTRAMUSCULAR | Status: AC
  Filled 2016-01-09: qty 40

## 2016-01-09 MED ORDER — SUGAMMADEX SODIUM 200 MG/2ML IV SOLN
INTRAVENOUS | Status: DC | PRN
  Administered 2016-01-09: 200 mg via INTRAVENOUS

## 2016-01-09 MED ORDER — PROPOFOL 10 MG/ML IV BOLUS
INTRAVENOUS | Status: DC | PRN
  Administered 2016-01-09: 200 mg via INTRAVENOUS

## 2016-01-09 MED ORDER — GLYCOPYRROLATE 0.2 MG/ML IJ SOLN
INTRAMUSCULAR | Status: DC | PRN
  Administered 2016-01-09: 0.2 mg via INTRAVENOUS

## 2016-01-09 MED ORDER — CEFAZOLIN SODIUM-DEXTROSE 2-4 GM/100ML-% IV SOLN
INTRAVENOUS | Status: AC
  Filled 2016-01-09: qty 100

## 2016-01-09 MED ORDER — LIDOCAINE HCL (PF) 1 % IJ SOLN
INTRAMUSCULAR | Status: AC
  Filled 2016-01-09: qty 5

## 2016-01-09 MED ORDER — OXYCODONE HCL 5 MG PO TABS: 5.0000 mg | ORAL_TABLET | ORAL | Status: DC | PRN

## 2016-01-09 MED ORDER — EPHEDRINE SULFATE 50 MG/ML IJ SOLN
INTRAMUSCULAR | Status: DC | PRN
  Administered 2016-01-09: 5 mg via INTRAVENOUS
  Administered 2016-01-09 (×3): 15 mg via INTRAVENOUS

## 2016-01-09 MED ORDER — PHENYLEPHRINE HCL 10 MG/ML IJ SOLN
INTRAMUSCULAR | Status: DC | PRN
  Administered 2016-01-09 (×3): 100 ug via INTRAVENOUS

## 2016-01-09 MED ORDER — MIDAZOLAM HCL 5 MG/5ML IJ SOLN
INTRAMUSCULAR | Status: AC
  Administered 2016-01-09: 2 mg via INTRAVENOUS
  Filled 2016-01-09: qty 5

## 2016-01-09 MED ORDER — EPINEPHRINE HCL 1 MG/ML IJ SOLN
INTRAMUSCULAR | Status: AC
  Filled 2016-01-09: qty 2

## 2016-01-09 MED ORDER — METOCLOPRAMIDE HCL 10 MG PO TABS: 5.0000 mg | ORAL_TABLET | Freq: Three times a day (TID) | ORAL | Status: DC | PRN

## 2016-01-09 MED ORDER — DEXAMETHASONE SODIUM PHOSPHATE 10 MG/ML IJ SOLN
INTRAMUSCULAR | Status: DC | PRN
  Administered 2016-01-09: 10 mg via INTRAVENOUS

## 2016-01-09 MED ORDER — HYDRALAZINE HCL 20 MG/ML IJ SOLN
INTRAMUSCULAR | Status: AC
  Administered 2016-01-09: 10 mg via INTRAVENOUS
  Filled 2016-01-09: qty 1

## 2016-01-09 MED ORDER — BUPIVACAINE-EPINEPHRINE 0.5% -1:200000 IJ SOLN
INTRAMUSCULAR | Status: DC | PRN
  Administered 2016-01-09: 29 mL

## 2016-01-09 MED ORDER — LABETALOL HCL 5 MG/ML IV SOLN
10.0000 mg | Freq: Once | INTRAVENOUS | Status: AC
  Administered 2016-01-09: 5 mg via INTRAVENOUS
  Filled 2016-01-09: qty 4

## 2016-01-09 MED ORDER — LIDOCAINE HCL (CARDIAC) 20 MG/ML IV SOLN
INTRAVENOUS | Status: DC | PRN
  Administered 2016-01-09: 100 mg via INTRAVENOUS

## 2016-01-09 MED ORDER — ONDANSETRON HCL 4 MG/2ML IJ SOLN
INTRAMUSCULAR | Status: DC | PRN
  Administered 2016-01-09: 4 mg via INTRAVENOUS

## 2016-01-09 MED ORDER — BUPIVACAINE-EPINEPHRINE (PF) 0.5% -1:200000 IJ SOLN
INTRAMUSCULAR | Status: AC
  Filled 2016-01-09: qty 30

## 2016-01-09 MED ORDER — FAMOTIDINE 20 MG PO TABS
20.0000 mg | ORAL_TABLET | Freq: Once | ORAL | Status: AC
  Administered 2016-01-09: 20 mg via ORAL

## 2016-01-09 MED ORDER — MIDAZOLAM HCL 2 MG/2ML IJ SOLN
1.0000 mg | Freq: Once | INTRAMUSCULAR | Status: AC
  Administered 2016-01-09: 2 mg via INTRAVENOUS

## 2016-01-09 SURGICAL SUPPLY — 46 items
ANCHOR JUGGERKNOT WTAP NDL 2.9 (Anchor) ×4 IMPLANT
BIT DRILL JUGRKNT W/NDL BIT2.9 (DRILL) ×1 IMPLANT
BLADE FULL RADIUS 3.5 (BLADE) ×2 IMPLANT
BLADE SHAVER 4.5X7 STR FR (MISCELLANEOUS) IMPLANT
BUR ACROMIONIZER 4.0 (BURR) ×2 IMPLANT
BUR BR 5.5 WIDE MOUTH (BURR) IMPLANT
CANNULA SHAVER 8MMX76MM (CANNULA) ×2 IMPLANT
CHLORAPREP W/TINT 26ML (MISCELLANEOUS) ×4 IMPLANT
CORD BIP STRL DISP 12FT (MISCELLANEOUS) ×2 IMPLANT
COVER MAYO STAND STRL (DRAPES) ×2 IMPLANT
DRAPE IMP U-DRAPE 54X76 (DRAPES) ×4 IMPLANT
DRILL JUGGERKNOT W/NDL BIT 2.9 (DRILL) ×2
DRSG OPSITE POSTOP 4X8 (GAUZE/BANDAGES/DRESSINGS) IMPLANT
ELECT REM PT RETURN 9FT ADLT (ELECTROSURGICAL)
ELECTRODE REM PT RTRN 9FT ADLT (ELECTROSURGICAL) IMPLANT
GAUZE PETRO XEROFOAM 1X8 (MISCELLANEOUS) ×2 IMPLANT
GAUZE SPONGE 4X4 12PLY STRL (GAUZE/BANDAGES/DRESSINGS) ×2 IMPLANT
GLOVE BIO SURGEON STRL SZ7.5 (GLOVE) ×4 IMPLANT
GLOVE BIO SURGEON STRL SZ8 (GLOVE) ×4 IMPLANT
GLOVE BIOGEL PI IND STRL 8 (GLOVE) ×1 IMPLANT
GLOVE BIOGEL PI INDICATOR 8 (GLOVE) ×1
GLOVE INDICATOR 8.0 STRL GRN (GLOVE) ×2 IMPLANT
GOWN STRL REUS W/ TWL LRG LVL3 (GOWN DISPOSABLE) ×2 IMPLANT
GOWN STRL REUS W/ TWL XL LVL3 (GOWN DISPOSABLE) ×1 IMPLANT
GOWN STRL REUS W/TWL LRG LVL3 (GOWN DISPOSABLE) ×2
GOWN STRL REUS W/TWL XL LVL3 (GOWN DISPOSABLE) ×1
GRASPER SUT 15 45D LOW PRO (SUTURE) IMPLANT
INSULATED JEWLERS FORCEPS ×2 IMPLANT
IV LACTATED RINGER IRRG 3000ML (IV SOLUTION) ×2
IV LR IRRIG 3000ML ARTHROMATIC (IV SOLUTION) ×2 IMPLANT
MANIFOLD NEPTUNE II (INSTRUMENTS) ×2 IMPLANT
MASK FACE SPIDER DISP (MASK) ×2 IMPLANT
MAT BLUE FLOOR 46X72 FLO (MISCELLANEOUS) ×2 IMPLANT
NEEDLE REVERSE CUT 1/2 CRC (NEEDLE) IMPLANT
PACK ARTHROSCOPY SHOULDER (MISCELLANEOUS) ×2 IMPLANT
SLING ARM LRG DEEP (SOFTGOODS) IMPLANT
SLING ULTRA II LG (MISCELLANEOUS) ×2 IMPLANT
STAPLER SKIN PROX 35W (STAPLE) ×2 IMPLANT
STRAP SAFETY BODY (MISCELLANEOUS) ×4 IMPLANT
SUT ETHIBOND 0 MO6 C/R (SUTURE) ×2 IMPLANT
SUT VIC AB 2-0 CT1 27 (SUTURE) ×2
SUT VIC AB 2-0 CT1 TAPERPNT 27 (SUTURE) ×2 IMPLANT
TAPE MICROFOAM 4IN (TAPE) ×2 IMPLANT
TUBING ARTHRO INFLOW-ONLY STRL (TUBING) ×2 IMPLANT
TUBING CONNECTING 10 (TUBING) ×2 IMPLANT
WAND HAND CNTRL MULTIVAC 90 (MISCELLANEOUS) ×2 IMPLANT

## 2016-01-09 NOTE — Op Note (Signed)
01/09/2016  11:26 AM  Patient:   Martin Hardy.  Pre-Op Diagnosis:   Recurrent rotator cuff tear, left shoulder.  Postoperative diagnosis: Recurrent rotator cuff tear with labral fraying and extensive synovitis, left shoulder.  Procedure: Limited arthroscopic debridement and mini-open rotator cuff repair, left shoulder.  Anesthesia: General endotracheal with interscalene block placed preoperatively by the anesthesiologist.  Surgeon:   Pascal Lux, MD  Assistant:   Cameron Proud, PA-C; Donnie Coffin, PA-S  Findings: As above. There was some labral fraying superiorly but no frank detachment of the labrum from the glenoid rim. There was a full-thickness recurrent tear of the supraspinatus tendon at the suture margin. The remaining portions of the rotator cuff were in excellent condition. No significant degenerative changes were noted.   Complications: None  Fluids:   500 cc  Estimated blood loss: 20 cc  Tourniquet time: None  Drains: None  Closure: Staples   Brief clinical note: The patient is a 70 year old male who is now over 6 months status post a left shoulder arthroscopy debridement, decompression, and repair of a rotator cuff tear. The patient appeared to be doing well for the first 3-4 months before he began to notice increased pain in his shoulder. The patient's symptoms have progressed despite medications, activity modification, etc. The patient's history and examination are consistent with impingement/tendinopathy with a rotator cuff tear. A subsequent arthrogram confirmed the presence of recurrent rotator cuff tear. The patient presents at this time for arthroscopy and repair of the recurrent rotator cuff tear.  Procedure: The patient underwent placement of an interscalene block by the anesthesiologist in the preoperative holding area before being brought into the operating room and lain in the supine position. The patient then underwent  general endotracheal intubation and anesthesia before being repositioned in the beach chair position using the beach chair positioner. The left shoulder and upper extremity were prepped with ChloraPrep solution before being draped sterilely. Preoperative antibiotics were administered. A timeout was performed to confirm the proper surgical site before the expected portal sites and incision site were injected with 0.5% Sensorcaine with epinephrine. A posterior portal was created and the glenohumeral joint thoroughly inspected with the findings as described above. An anterior portal was created using an outside-in technique. The labrum and rotator cuff were further probed, again confirming the above-noted findings. The areas extensive synovitis were debrided back to stable margins using the full-radius resector, as was the area of degenerative fraying involving the superior portion of the labrum. The frayed margins of the rotator cuff also were debrided as full-radius resector. The ArthroCare wand was inserted and used to obtain hemostasis as well as to "anneal" the labrum superiorly. The instruments were removed from the joint after suctioning the excess fluid.  The camera was repositioned through the posterior portal into the subacromial space. A separate lateral portal was created using an outside-in technique. The 3.5 mm full-radius resector was introduced and used to perform a subtotal bursectomy, As well asto remove the periosteal tissue off the undersurface of the anterior third of the acromion. The  ArthroCare wand was introduced and used to obtain hemostasis. The instruments were then removed from the subacromial space after suctioning the excess fluid.  An approximately 4-5 cm incision was made over the anterolateral aspect of the shoulder beginning at the anterolateral corner of the acromion and extending distally in line with the bicipital groove. This incision was carried down through the subcutaneous  tissues to expose the deltoid fascia. The raphae between  the anterior and middle thirds was identified and this plane developed to provide access into the subacromial space. Additional bursal tissues were debrided sharply using Metzenbaum scissors. The rotator cuff tear was readily identified. The margins were debrided sharply with a #15 blade and the exposed greater tuberosity roughened with a rongeur. The tear was repaired using two Biomet 2.9 mm JuggerKnot anchors. Several of these sutures were then brought back laterally through the remaining rotator cuff still attached to the greater tuberosity to create a two-layer closure. Several additional #0 Ethibond interrupted sutures were placed in a side-to-side fashion to reinforce this repair. An apparent watertight closure was obtained.  The wound was copiously irrigated with sterile saline solution before the deltoid raphae was reapproximated using 2-0 Vicryl interrupted sutures. The subcutaneous tissues were closed in two layers using 2-0 Vicryl interrupted sutures before the skin was closed using staples. The portal sites also were closed using staples. A sterile bulky dressing was applied to the shoulder before the arm was placed into a shoulder immobilizer. The patient was then awakened, extubated, and returned to the recovery room in satisfactory condition after tolerating the procedure well.

## 2016-01-09 NOTE — Discharge Instructions (Addendum)
Keep dressing dry and intact.  °May shower after dressing changed on post-op day #4 (Monday).  °Cover staples with Band-Aids after drying off. °Apply ice frequently to shoulder. °Take ibuprofen 800 mg TID with meals for 7-10 days, then as necessary. °Take oxycodone as prescribed when needed.  °May supplement with ES Tylenol if necessary. °Keep shoulder immobilizer on at all times except may remove for bathing purposes. °Follow-up in 10-14 days or as scheduled. °AMBULATORY SURGERY  °DISCHARGE INSTRUCTIONS ° ° °1) The drugs that you were given will stay in your system until tomorrow so for the next 24 hours you should not: ° °A) Drive an automobile °B) Make any legal decisions °C) Drink any alcoholic beverage ° ° °2) You may resume regular meals tomorrow.  Today it is better to start with liquids and gradually work up to solid foods. ° °You may eat anything you prefer, but it is better to start with liquids, then soup and crackers, and gradually work up to solid foods. ° ° °3) Please notify your doctor immediately if you have any unusual bleeding, trouble breathing, redness and pain at the surgery site, drainage, fever, or pain not relieved by medication. ° ° ° °4) Additional Instructions: ° ° ° °Please contact your physician with any problems or Same Day Surgery at [phone], Monday through Friday 6 am to 4 pm, or [place] [facility] number at [phone]. °

## 2016-01-09 NOTE — H&P (Signed)
Paper H&P to be scanned into permanent record. H&P reviewed. No changes. 

## 2016-01-09 NOTE — OR Nursing (Signed)
Pt toPACU for block report given to Lucina Mellow RN

## 2016-01-09 NOTE — Anesthesia Preprocedure Evaluation (Signed)
Anesthesia Evaluation  Patient identified by MRN, date of birth, ID band Patient awake    Reviewed: Allergy & Precautions, H&P , NPO status , Patient's Chart, lab work & pertinent test results, reviewed documented beta blocker date and time   Airway Mallampati: IV  TM Distance: <3 FB Neck ROM: full    Dental  (+) Teeth Intact   Pulmonary neg pulmonary ROS, sleep apnea ,    Pulmonary exam normal        Cardiovascular hypertension, + Peripheral Vascular Disease  (-) CHF negative cardio ROS Normal cardiovascular exam+ dysrhythmias + pacemaker + Cardiac Defibrillator  Rhythm:regular Rate:Normal     Neuro/Psych PSYCHIATRIC DISORDERS  Neuromuscular disease negative neurological ROS  negative psych ROS   GI/Hepatic negative GI ROS, Neg liver ROS, PUD,   Endo/Other  negative endocrine ROS  Renal/GU negative Renal ROS  negative genitourinary   Musculoskeletal   Abdominal   Peds  Hematology negative hematology ROS (+)   Anesthesia Other Findings Past Medical History: No date: AICD (automatic cardioverter/defibrillator) pr* No date: Alcohol abuse No date: Bladder tumor No date: BPH (benign prostatic hyperplasia) 2012: Cancer ([place])     Comment: [place]yp resected colonoscopy No date: Cardiomyopathy ([place]) No date: CHF (congestive heart failure) ([facility])     Comment: CHRONIC No date: Depression No date: Diverticulosis No date: Dysrhythmia No date: Enlarged heart No date: Enlarged heart No date: Epididymal mass No date: History of bleeding peptic ulcer No date: History of blood transfusion No date: Hx MRSA infection No date: Hypertension No date: Obesity No date: Overweight No date: Presence of permanent cardiac pacemaker No date: PUD (peptic ulcer disease) No date: Scrotal cyst No date: Sleep apnea     Comment: cpap No date: Spermatocele No date: Urinary retention Past Surgical History: No date: CATARACT EXTRACTION,  BILATERAL No date: EP IMPLANTABLE DEVICE No date: EYE SURGERY No date: FOOT NEUROMA SURGERY No date: gastric ulcer 09/24/2014: GREEN LIGHT LASER TURP (TRANSURETHRAL RESECTIO* N/A     Comment: Procedure: GREEN LIGHT LASER TURP               (TRANSURETHRAL RESECTION OF PROSTATE;  Surgeon:              Collier Flowers, MD;  Location: [facility] ORS;                Service: Urology;  Laterality: N/A; No date: HERNIA REPAIR 06/13/2015: SHOULDER ARTHROSCOPY WITH OPEN ROTATOR CUFF RE* Left     Comment: Procedure: SHOULDER ARTHROSCOPIC debridement               and decompression and repair of rotator cuff               tear;  Surgeon: Corky Mull, MD;  Location:               [facility] ORS;  Service: Orthopedics;  Laterality:               Left; No date: VAGOTOMY BMI    Body Mass Index:  32.49 kg/m     Reproductive/Obstetrics negative OB ROS                             Anesthesia Physical Anesthesia Plan  ASA: III  Anesthesia Plan: General ETT   Post-op Pain Management:  Regional for Post-op pain   Induction:   Airway Management Planned:   Additional Equipment:   Intra-op Plan:  Post-operative Plan:   Informed Consent: I have reviewed the patients History and Physical, chart, labs and discussed the procedure including the risks, benefits and alternatives for the proposed anesthesia with the patient or authorized representative who has indicated his/her understanding and acceptance.   Dental Advisory Given  Plan Discussed with: CRNA  Anesthesia Plan Comments: (Clearance per [place].  JA)        Anesthesia Quick Evaluation

## 2016-01-09 NOTE — Anesthesia Procedure Notes (Signed)
Procedure Name: Intubation Date/Time: 01/09/2016 9:25 AM Performed by: Darlyne Russian Pre-anesthesia Checklist: Patient identified, Emergency Drugs available, Suction available and Patient being monitored Patient Re-evaluated:Patient Re-evaluated prior to inductionOxygen Delivery Method: Circle system utilized Preoxygenation: Pre-oxygenation with 100% oxygen Intubation Type: IV induction Ventilation: Oral airway inserted - appropriate to patient size and Two handed mask ventilation required Laryngoscope Size: Glidescope and 3 Grade View: Grade I Tube type: Oral Tube size: 7.5 mm Number of attempts: 1 Airway Equipment and Method: Stylet Placement Confirmation: ETT inserted through vocal cords under direct vision,  positive ETCO2 and breath sounds checked- equal and bilateral Secured at: 22 cm Tube secured with: Tape Dental Injury: Teeth and Oropharynx as per pre-operative assessment  Difficulty Due To: Difficulty was anticipated Comments: Difficult 2 hand mask airway with oral airway in place, unable to get good seal (facial hair present).  Easy placement of ETT using glidescope with direct visualization per Dr. Andree Elk.  Elective glidescope intubation based on airway exam.

## 2016-01-09 NOTE — Transfer of Care (Signed)
Immediate Anesthesia Transfer of Care Note  Patient: Martin Hardy.  Procedure(s) Performed: Procedure(s): SHOULDER ARTHROSCOPY, DEBRIDEMENT AND WITH OPEN ROTATOR CUFF REPAIR (Left)  Patient Location: PACU  Anesthesia Type:GA combined with regional for post-op pain  Level of Consciousness: awake and alert   Airway & Oxygen Therapy: Patient Spontanous Breathing and Patient connected to nasal cannula oxygen  Post-op Assessment: Report given to RN and Post -op Vital signs reviewed and stable  Post vital signs: Reviewed and stable  Last Vitals:  Vitals:   01/09/16 0912 01/09/16 1131  BP: (!) 210/104 (!) 147/91  Pulse: 65 73  Resp: 17 17  Temp:  36.2 C    Last Pain:  Vitals:   01/09/16 0756  TempSrc: Tympanic  PainSc: 2          Complications: No apparent anesthesia complications

## 2016-01-09 NOTE — Anesthesia Procedure Notes (Signed)
Anesthesia Regional Block:  Interscalene brachial plexus block  Pre-Anesthetic Checklist: ,, timeout performed, Correct Patient, Correct Site, Correct Laterality, Correct Procedure, Correct Position, site marked, Risks and benefits discussed,  Surgical consent,  Pre-op evaluation,  At surgeon's request and post-op pain management   Prep: chloraprep       Needles:   Needle Type: Echogenic Stimulator Needle     Needle Length: 5cm 5 cm Needle Gauge: 21 and 21 G    Additional Needles:  Procedures: ultrasound guided (picture in chart) and nerve stimulator Interscalene brachial plexus block  Nerve Stimulator or Paresthesia:  Response: 100 mA, 3 cm  Additional Responses:   Narrative:  Injection made incrementally with aspirations every 5 mL.  Performed by: Personally  Anesthesiologist: Devlin Brink, Alvina Filbert  Additional Notes: After informed consent was obtained, the patient chose to proceed with an isnb for POP as requested by the surgeon.  IV sedation was obtained and the procedure was completed without difficulty.  Neg IVTD and no pain with injection.  The procedure was tolerated well with vsst. Greggory Brandy

## 2016-01-13 NOTE — Anesthesia Postprocedure Evaluation (Signed)
Anesthesia Post Note  Patient: Martin Hardy.  Procedure(s) Performed: Procedure(s) (LRB): SHOULDER ARTHROSCOPY, DEBRIDEMENT AND WITH OPEN ROTATOR CUFF REPAIR (Left)  Patient location during evaluation: PACU Anesthesia Type: General Level of consciousness: awake and alert Pain management: pain level controlled Vital Signs Assessment: post-procedure vital signs reviewed and stable Respiratory status: spontaneous breathing, nonlabored ventilation, respiratory function stable and patient connected to nasal cannula oxygen Cardiovascular status: blood pressure returned to baseline and stable Postop Assessment: no signs of nausea or vomiting Anesthetic complications: no    Last Vitals:  Vitals:   01/09/16 1224 01/09/16 1230  BP: 120/72 139/73  Pulse: 64 61  Resp: 16 16  Temp:      Last Pain:  Vitals:   01/10/16 0824  TempSrc:   PainSc: 0-No pain                 Molli Barrows

## 2016-05-05 ENCOUNTER — Encounter: Payer: Self-pay | Admitting: *Deleted

## 2016-05-13 ENCOUNTER — Encounter
Admission: RE | Disposition: A | Discharge status: Home / Self Care | Payer: Self-pay | Source: Ambulatory Visit | Attending: Surgery

## 2016-05-13 ENCOUNTER — Ambulatory Visit: Payer: Medicare Other | Admitting: Anesthesiology

## 2016-05-13 ENCOUNTER — Ambulatory Visit
Admission: RE | Admit: 2016-05-13 | Discharge: 2016-05-13 | Disposition: A | Discharge status: Home / Self Care | Payer: Medicare Other | Source: Ambulatory Visit | Attending: Surgery | Admitting: Surgery

## 2016-05-13 DIAGNOSIS — M65341 Trigger finger, right ring finger: Secondary | ICD-10-CM | POA: Insufficient documentation

## 2016-05-13 DIAGNOSIS — I429 Cardiomyopathy, unspecified: Secondary | ICD-10-CM | POA: Insufficient documentation

## 2016-05-13 DIAGNOSIS — Z7982 Long term (current) use of aspirin: Secondary | ICD-10-CM | POA: Diagnosis not present

## 2016-05-13 DIAGNOSIS — K219 Gastro-esophageal reflux disease without esophagitis: Secondary | ICD-10-CM | POA: Insufficient documentation

## 2016-05-13 DIAGNOSIS — Z79899 Other long term (current) drug therapy: Secondary | ICD-10-CM | POA: Insufficient documentation

## 2016-05-13 DIAGNOSIS — I509 Heart failure, unspecified: Secondary | ICD-10-CM | POA: Insufficient documentation

## 2016-05-13 DIAGNOSIS — Z95810 Presence of automatic (implantable) cardiac defibrillator: Secondary | ICD-10-CM | POA: Diagnosis not present

## 2016-05-13 DIAGNOSIS — F329 Major depressive disorder, single episode, unspecified: Secondary | ICD-10-CM | POA: Insufficient documentation

## 2016-05-13 DIAGNOSIS — I110 Hypertensive heart disease with heart failure: Secondary | ICD-10-CM | POA: Insufficient documentation

## 2016-05-13 HISTORY — PX: TRIGGER FINGER RELEASE: SHX641

## 2016-05-13 HISTORY — DX: Gastro-esophageal reflux disease without esophagitis: K21.9

## 2016-05-13 SURGERY — RELEASE, A1 PULLEY, FOR TRIGGER FINGER
Anesthesia: Regional | Site: Hand | Laterality: Right | Wound class: Clean

## 2016-05-13 MED ORDER — METOCLOPRAMIDE HCL 5 MG PO TABS: 5.0000 mg | ORAL_TABLET | Freq: Three times a day (TID) | ORAL | Status: DC | PRN

## 2016-05-13 MED ORDER — OXYCODONE HCL 5 MG PO TABS: 5.0000 mg | ORAL_TABLET | ORAL | 0 refills | Status: DC | PRN

## 2016-05-13 MED ORDER — ONDANSETRON HCL 4 MG PO TABS: 4.0000 mg | ORAL_TABLET | Freq: Four times a day (QID) | ORAL | Status: DC | PRN

## 2016-05-13 MED ORDER — FENTANYL CITRATE (PF) 100 MCG/2ML IJ SOLN
INTRAMUSCULAR | Status: DC | PRN
  Administered 2016-05-13 (×2): 50 ug via INTRAVENOUS

## 2016-05-13 MED ORDER — METOCLOPRAMIDE HCL 5 MG/ML IJ SOLN: 5.0000 mg | Freq: Three times a day (TID) | INTRAMUSCULAR | Status: DC | PRN

## 2016-05-13 MED ORDER — OXYCODONE HCL 5 MG PO TABS: 5.0000 mg | ORAL_TABLET | ORAL | Status: DC | PRN

## 2016-05-13 MED ORDER — BUPIVACAINE HCL (PF) 0.5 % IJ SOLN
INTRAMUSCULAR | Status: DC | PRN
  Administered 2016-05-13: 5 mL

## 2016-05-13 MED ORDER — LIDOCAINE HCL (CARDIAC) 20 MG/ML IV SOLN
INTRAVENOUS | Status: DC | PRN
  Administered 2016-05-13: 50 mg via INTRAVENOUS

## 2016-05-13 MED ORDER — LACTATED RINGERS IV SOLN
INTRAVENOUS | Status: DC
  Administered 2016-05-13: 13:00:00 via INTRAVENOUS

## 2016-05-13 MED ORDER — CEFAZOLIN SODIUM-DEXTROSE 2-4 GM/100ML-% IV SOLN
2.0000 g | Freq: Once | INTRAVENOUS | Status: AC
  Administered 2016-05-13: 2 g via INTRAVENOUS

## 2016-05-13 MED ORDER — MIDAZOLAM HCL 2 MG/2ML IJ SOLN
INTRAMUSCULAR | Status: DC | PRN
  Administered 2016-05-13: 2 mg via INTRAVENOUS

## 2016-05-13 MED ORDER — PROPOFOL 500 MG/50ML IV EMUL
INTRAVENOUS | Status: DC | PRN
  Administered 2016-05-13: 50 ug/kg/min via INTRAVENOUS

## 2016-05-13 MED ORDER — ONDANSETRON HCL 4 MG/2ML IJ SOLN: 4.0000 mg | Freq: Four times a day (QID) | INTRAMUSCULAR | Status: DC | PRN

## 2016-05-13 MED ORDER — POTASSIUM CHLORIDE IN NACL 20-0.9 MEQ/L-% IV SOLN
INTRAVENOUS | Status: DC
Start: 2016-05-13 — End: 2016-05-13

## 2016-05-13 SURGICAL SUPPLY — 24 items
BANDAGE ELASTIC 2 LF NS (GAUZE/BANDAGES/DRESSINGS) ×2 IMPLANT
BANDAGE ELASTIC 3 LF NS (GAUZE/BANDAGES/DRESSINGS) IMPLANT
BNDG ESMARK 4X12 TAN STRL LF (GAUZE/BANDAGES/DRESSINGS) ×2 IMPLANT
CHLORAPREP W/TINT 26ML (MISCELLANEOUS) ×2 IMPLANT
CORD BIP STRL DISP 12FT (MISCELLANEOUS) ×2 IMPLANT
COVER LIGHT HANDLE UNIVERSAL (MISCELLANEOUS) ×4 IMPLANT
CUFF TOURNIQUET DUAL PORT 18X3 (MISCELLANEOUS) ×2 IMPLANT
DECANTER SPIKE VIAL GLASS SM (MISCELLANEOUS) ×2 IMPLANT
GAUZE PETRO XEROFOAM 1X8 (MISCELLANEOUS) ×2 IMPLANT
GAUZE SPONGE 4X4 12PLY STRL (GAUZE/BANDAGES/DRESSINGS) ×2 IMPLANT
GLOVE BIO SURGEON STRL SZ8 (GLOVE) ×6 IMPLANT
GLOVE INDICATOR 8.0 STRL GRN (GLOVE) ×4 IMPLANT
GOWN STRL REUS W/ TWL LRG LVL3 (GOWN DISPOSABLE) ×1 IMPLANT
GOWN STRL REUS W/ TWL XL LVL3 (GOWN DISPOSABLE) ×2 IMPLANT
GOWN STRL REUS W/TWL LRG LVL3 (GOWN DISPOSABLE) ×1
GOWN STRL REUS W/TWL XL LVL3 (GOWN DISPOSABLE) ×2
KIT ROOM TURNOVER OR (KITS) ×2 IMPLANT
NS IRRIG 500ML POUR BTL (IV SOLUTION) ×2 IMPLANT
PACK EXTREMITY ARMC (MISCELLANEOUS) ×2 IMPLANT
STOCKINETTE IMPERVIOUS 9X36 MD (GAUZE/BANDAGES/DRESSINGS) ×2 IMPLANT
STRAP BODY AND KNEE 60X3 (MISCELLANEOUS) ×2 IMPLANT
SUT PROLENE 4 0 PS 2 18 (SUTURE) ×2 IMPLANT
SUT VIC AB 3-0 SH 27 (SUTURE)
SUT VIC AB 3-0 SH 27X BRD (SUTURE) IMPLANT

## 2016-05-13 NOTE — Discharge Instructions (Signed)
General Anesthesia, Adult, Care After °These instructions provide you with information about caring for yourself after your procedure. Your health care provider may also give you more specific instructions. Your treatment has been planned according to current medical practices, but problems sometimes occur. Call your health care provider if you have any problems or questions after your procedure. °What can I expect after the procedure? °After the procedure, it is common to have: °· Vomiting. °· A sore throat. °· Mental slowness. °It is common to feel: °· Nauseous. °· Cold or shivery. °· Sleepy. °· Tired. °· Sore or achy, even in parts of your body where you did not have surgery. °Follow these instructions at home: °For at least 24 hours after the procedure: °· Do not: °¨ Participate in activities where you could fall or become injured. °¨ Drive. °¨ Use heavy machinery. °¨ Drink alcohol. °¨ Take sleeping pills or medicines that cause drowsiness. °¨ Make important decisions or sign legal documents. °¨ Take care of children on your own. °· Rest. °Eating and drinking °· If you vomit, drink water, juice, or soup when you can drink without vomiting. °· Drink enough fluid to keep your urine clear or pale yellow. °· Make sure you have little or no nausea before eating solid foods. °· Follow the diet recommended by your health care provider. °General instructions °· Have a responsible adult stay with you until you are awake and alert. °· Return to your normal activities as told by your health care provider. Ask your health care provider what activities are safe for you. °· Take over-the-counter and prescription medicines only as told by your health care provider. °· If you smoke, do not smoke without supervision. °· Keep all follow-up visits as told by your health care provider. This is important. °Contact a health care provider if: °· You continue to have nausea or vomiting at home, and medicines are not helpful. °· You  cannot drink fluids or start eating again. °· You cannot urinate after 8-12 hours. °· You develop a skin rash. °· You have fever. °· You have increasing redness at the site of your procedure. °Get help right away if: °· You have difficulty breathing. °· You have chest pain. °· You have unexpected bleeding. °· You feel that you are having a life-threatening or urgent problem. °This information is not intended to replace advice given to you by your health care provider. Make sure you discuss any questions you have with your health care provider. °Document Released: 07/27/2000 Document Revised: 09/23/2015 Document Reviewed: 04/04/2015 °Elsevier Interactive Patient Education © 2017 [employer] ° °Keep dressing dry and intact. °Keep hand elevated above heart level. °May shower after dressing removed on postop day 4 (Sunday). °Cover sutures with Band-Aids after drying off.  °Apply ice to affected area frequently. °Take pain medication as prescribed or ES Tylenol when needed.  °Return for follow-up in 10-14 days or as scheduled. °

## 2016-05-13 NOTE — Op Note (Signed)
05/13/2016  4:07 PM  Patient:   Martin Hardy.  Pre-Op Diagnosis:   Right ring trigger finger.  Post-Op Diagnosis:   Same  Procedure:   Release right ring trigger finger.  Surgeon:   Pascal Lux, MD  Assistant:   Sung Amabile, PA-S  Anesthesia:   Bier block  Findings:   As above.  Complications:   None  EBL:   1 cc  Fluids:   700 cc crystalloid  TT:   24 minutes at 250 mmHg  Drains:   None  Closure:   4-0 Prolene  Implants:   None  Brief Clinical Note:   The patient is a 71 year old male with a 4-6 week history of painful catching of his right ring finger. These symptoms have progressed despite medications, activity modification, etc. The patient's history and examination were consistent with a right ring trigger finger. The patient presents at this time for a right ring trigger finger release.  Procedure:   The patient was brought into the operating room and lain in the supine position. After adequate IV sedation was achieved, a timeout was performed to verify the appropriate surgical site. A Bier block was placed by the anesthesiologist and the tourniquet inflated to 250 mmHg. The right hand and upper extremity were prepped with ChloraPrep solution before being draped sterilely. Preoperative antibiotics were administered. An approximately 1.5-2.0 cm incision was made over the volar aspect of the ring finger at the level of the metacarpal head centered over the flexor sheath. The incision was carried down through the subcutaneous tissues with care taken to identify and protect the digital neurovascular structures. The flexor sheath was entered just proximal to the A1 pulley. The sheath was released proximally for several centimeters under direct visualization. Distally, a clamp was placed beneath the A1 pulley and used to release any adhesions. The clamp was repositioned so that one jaw was superficial to and the other jaw deep to the A1 pulley. The A1 pulley was incised  on either side of the clamp to remove a 2 mm strip of tissue. Metzenbaum scissors was used to ensure complete release of the A1 pulley more distally. The underlying tendons were carefully inspected and found to be intact.   The wound was copiously irrigated with sterile saline solution before the wound was closed using 4-0 Prolene interrupted sutures. A total of 10 cc of 0.5% plain Sensorcaine was injected in and around the incision to help with postoperative analgesia before a sterile bulky dressing was applied to the hand. The patient was then awakened and returned to the recovery room in satisfactory condition after tolerating the procedure well.

## 2016-05-13 NOTE — Anesthesia Procedure Notes (Signed)
Anesthesia Regional Block:  Bier block (IV Regional)  Pre-Anesthetic Checklist: ,, timeout performed, Correct Patient, Correct Site, Correct Laterality, Correct Procedure, Correct Position, site marked, Risks and benefits discussed, Surgical consent,  Pre-op evaluation,  At surgeon's request  Laterality: Right  Prep: chloraprep       Bier block (IV Regional) Narrative:  Start time: 05/13/2016 3:37 PM End time: 05/13/2016 3:38 PM Injection made incrementally with aspirations every 30 mL.  Performed by: With CRNAs  Anesthesiologist: [facility], SCOTT  Additional Notes: 0.5% Lidocaine plain injected without difficulty. Pt denies any s/s of toxicity

## 2016-05-13 NOTE — Anesthesia Postprocedure Evaluation (Signed)
Anesthesia Post Note  Patient: Martin Hardy.  Procedure(s) Performed: Procedure(s) (LRB): RELEASE TRIGGER FINGER right ring finger (Right)  Patient location during evaluation: PACU Anesthesia Type: Bier Block Level of consciousness: awake and alert Pain management: pain level controlled Vital Signs Assessment: post-procedure vital signs reviewed and stable Respiratory status: spontaneous breathing, nonlabored ventilation, respiratory function stable and patient connected to nasal cannula oxygen Cardiovascular status: blood pressure returned to baseline and stable Postop Assessment: no signs of nausea or vomiting Anesthetic complications: no    Marshell Levan

## 2016-05-13 NOTE — Transfer of Care (Signed)
Immediate Anesthesia Transfer of Care Note  Patient: Martin Hardy.  Procedure(s) Performed: Procedure(s) with comments: RELEASE TRIGGER FINGER right ring finger (Right) - sleep apnea  Patient Location: PACU  Anesthesia Type: Bier Block  Level of Consciousness: awake, alert  and patient cooperative  Airway and Oxygen Therapy: Patient Spontanous Breathing and Patient connected to supplemental oxygen  Post-op Assessment: Post-op Vital signs reviewed, Patient's Cardiovascular Status Stable, Respiratory Function Stable, Patent Airway and No signs of Nausea or vomiting  Post-op Vital Signs: Reviewed and stable  Complications: No apparent anesthesia complications

## 2016-05-13 NOTE — H&P (Signed)
Paper H&P to be scanned into permanent record. H&P reviewed. No changes. 

## 2016-05-13 NOTE — Anesthesia Preprocedure Evaluation (Signed)
Anesthesia Evaluation  Patient identified by MRN, date of birth, ID band Patient awake    Reviewed: Allergy & Precautions, H&P , NPO status , Patient's Chart, lab work & pertinent test results  Airway Mallampati: III  TM Distance: >3 FB Neck ROM: full    Dental  (+) Teeth Intact   Pulmonary neg pulmonary ROS, sleep apnea ,    Pulmonary exam normal        Cardiovascular hypertension, + Peripheral Vascular Disease  (-) CHF negative cardio ROS Normal cardiovascular exam+ dysrhythmias + pacemaker + Cardiac Defibrillator  Rhythm:regular Rate:Normal     Neuro/Psych PSYCHIATRIC DISORDERS  Neuromuscular disease negative neurological ROS  negative psych ROS   GI/Hepatic negative GI ROS, Neg liver ROS, PUD, GERD  Medicated and Controlled,  Endo/Other  negative endocrine ROS  Renal/GU negative Renal ROS  negative genitourinary   Musculoskeletal   Abdominal   Peds  Hematology negative hematology ROS (+)   Anesthesia Other Findings     Reproductive/Obstetrics negative OB ROS                             Anesthesia Physical  Anesthesia Plan  ASA: III  Anesthesia Plan: Bier Block   Post-op Pain Management:    Induction: Intravenous  Airway Management Planned:   Additional Equipment:   Intra-op Plan:   Post-operative Plan:   Informed Consent: I have reviewed the patients History and Physical, chart, labs and discussed the procedure including the risks, benefits and alternatives for the proposed anesthesia with the patient or authorized representative who has indicated his/her understanding and acceptance.     Plan Discussed with: CRNA  Anesthesia Plan Comments:         Anesthesia Quick Evaluation

## 2016-05-13 NOTE — Anesthesia Procedure Notes (Signed)
Procedure Name: MAC Performed by: Briannah Lona Pre-anesthesia Checklist: Patient identified, Emergency Drugs available, Suction available, Timeout performed and Patient being monitored Patient Re-evaluated:Patient Re-evaluated prior to inductionOxygen Delivery Method: Nasal cannula Placement Confirmation: positive ETCO2     

## 2016-05-14 ENCOUNTER — Encounter: Payer: Self-pay | Admitting: Surgery

## 2016-05-18 ENCOUNTER — Other Ambulatory Visit: Payer: Self-pay | Admitting: Student

## 2016-05-18 DIAGNOSIS — R1013 Epigastric pain: Secondary | ICD-10-CM

## 2016-05-18 DIAGNOSIS — K760 Fatty (change of) liver, not elsewhere classified: Secondary | ICD-10-CM

## 2016-05-25 ENCOUNTER — Ambulatory Visit: Payer: Medicare Other

## 2016-06-01 ENCOUNTER — Ambulatory Visit
Admission: RE | Admit: 2016-06-01 | Discharge: 2016-06-01 | Disposition: A | Discharge status: Home / Self Care | Payer: Medicare Other | Source: Ambulatory Visit | Attending: Student | Admitting: Student

## 2016-06-01 DIAGNOSIS — K760 Fatty (change of) liver, not elsewhere classified: Secondary | ICD-10-CM | POA: Diagnosis present

## 2016-06-01 DIAGNOSIS — R1013 Epigastric pain: Secondary | ICD-10-CM | POA: Insufficient documentation

## 2016-06-01 DIAGNOSIS — R161 Splenomegaly, not elsewhere classified: Secondary | ICD-10-CM | POA: Insufficient documentation

## 2016-06-01 DIAGNOSIS — K8020 Calculus of gallbladder without cholecystitis without obstruction: Secondary | ICD-10-CM | POA: Diagnosis not present

## 2016-06-01 DIAGNOSIS — N281 Cyst of kidney, acquired: Secondary | ICD-10-CM | POA: Insufficient documentation

## 2016-07-21 ENCOUNTER — Encounter: Payer: Self-pay | Admitting: *Deleted

## 2016-07-22 ENCOUNTER — Encounter
Admission: RE | Disposition: A | Discharge status: Home / Self Care | Payer: Self-pay | Source: Ambulatory Visit | Attending: Unknown Physician Specialty

## 2016-07-22 ENCOUNTER — Ambulatory Visit
Admission: RE | Admit: 2016-07-22 | Discharge: 2016-07-22 | Disposition: A | Discharge status: Home / Self Care | Payer: Medicare Other | Source: Ambulatory Visit | Attending: Unknown Physician Specialty | Admitting: Unknown Physician Specialty

## 2016-07-22 ENCOUNTER — Ambulatory Visit: Payer: Medicare Other | Admitting: Anesthesiology

## 2016-07-22 DIAGNOSIS — K640 First degree hemorrhoids: Secondary | ICD-10-CM | POA: Insufficient documentation

## 2016-07-22 DIAGNOSIS — K219 Gastro-esophageal reflux disease without esophagitis: Secondary | ICD-10-CM | POA: Insufficient documentation

## 2016-07-22 DIAGNOSIS — N400 Enlarged prostate without lower urinary tract symptoms: Secondary | ICD-10-CM | POA: Diagnosis not present

## 2016-07-22 DIAGNOSIS — K5730 Diverticulosis of large intestine without perforation or abscess without bleeding: Secondary | ICD-10-CM | POA: Insufficient documentation

## 2016-07-22 DIAGNOSIS — I509 Heart failure, unspecified: Secondary | ICD-10-CM | POA: Insufficient documentation

## 2016-07-22 DIAGNOSIS — Z86010 Personal history of colonic polyps: Secondary | ICD-10-CM | POA: Insufficient documentation

## 2016-07-22 DIAGNOSIS — Z1211 Encounter for screening for malignant neoplasm of colon: Secondary | ICD-10-CM | POA: Insufficient documentation

## 2016-07-22 DIAGNOSIS — G4730 Sleep apnea, unspecified: Secondary | ICD-10-CM | POA: Insufficient documentation

## 2016-07-22 DIAGNOSIS — E669 Obesity, unspecified: Secondary | ICD-10-CM | POA: Insufficient documentation

## 2016-07-22 DIAGNOSIS — Z79899 Other long term (current) drug therapy: Secondary | ICD-10-CM | POA: Diagnosis not present

## 2016-07-22 DIAGNOSIS — F329 Major depressive disorder, single episode, unspecified: Secondary | ICD-10-CM | POA: Insufficient documentation

## 2016-07-22 DIAGNOSIS — Z8711 Personal history of peptic ulcer disease: Secondary | ICD-10-CM | POA: Diagnosis not present

## 2016-07-22 DIAGNOSIS — Q438 Other specified congenital malformations of intestine: Secondary | ICD-10-CM | POA: Diagnosis not present

## 2016-07-22 DIAGNOSIS — Z95810 Presence of automatic (implantable) cardiac defibrillator: Secondary | ICD-10-CM | POA: Insufficient documentation

## 2016-07-22 DIAGNOSIS — I429 Cardiomyopathy, unspecified: Secondary | ICD-10-CM | POA: Diagnosis not present

## 2016-07-22 DIAGNOSIS — Z7982 Long term (current) use of aspirin: Secondary | ICD-10-CM | POA: Diagnosis not present

## 2016-07-22 DIAGNOSIS — Z6832 Body mass index (BMI) 32.0-32.9, adult: Secondary | ICD-10-CM | POA: Diagnosis not present

## 2016-07-22 DIAGNOSIS — I110 Hypertensive heart disease with heart failure: Secondary | ICD-10-CM | POA: Diagnosis not present

## 2016-07-22 HISTORY — PX: COLONOSCOPY WITH PROPOFOL: SHX5780

## 2016-07-22 SURGERY — COLONOSCOPY WITH PROPOFOL
Anesthesia: General

## 2016-07-22 MED ORDER — FENTANYL CITRATE (PF) 100 MCG/2ML IJ SOLN
INTRAMUSCULAR | Status: DC | PRN
  Administered 2016-07-22 (×2): 50 ug via INTRAVENOUS

## 2016-07-22 MED ORDER — FENTANYL CITRATE (PF) 100 MCG/2ML IJ SOLN
INTRAMUSCULAR | Status: AC
  Filled 2016-07-22: qty 2

## 2016-07-22 MED ORDER — MIDAZOLAM HCL 2 MG/2ML IJ SOLN
INTRAMUSCULAR | Status: AC
  Filled 2016-07-22: qty 2

## 2016-07-22 MED ORDER — MIDAZOLAM HCL 5 MG/5ML IJ SOLN
INTRAMUSCULAR | Status: DC | PRN
  Administered 2016-07-22: 2 mg via INTRAVENOUS

## 2016-07-22 MED ORDER — LIDOCAINE HCL (PF) 2 % IJ SOLN
INTRAMUSCULAR | Status: DC | PRN
  Administered 2016-07-22: 50 mg

## 2016-07-22 MED ORDER — PROPOFOL 500 MG/50ML IV EMUL
INTRAVENOUS | Status: DC | PRN
  Administered 2016-07-22: 100 ug/kg/min via INTRAVENOUS

## 2016-07-22 MED ORDER — SODIUM CHLORIDE 0.9 % IV SOLN: INTRAVENOUS | Status: DC

## 2016-07-22 MED ORDER — SODIUM CHLORIDE 0.9 % IV SOLN
INTRAVENOUS | Status: DC
  Administered 2016-07-22: 13:00:00 via INTRAVENOUS

## 2016-07-22 MED ORDER — PROPOFOL 10 MG/ML IV BOLUS
INTRAVENOUS | Status: DC | PRN
Start: 2016-07-22 — End: 2016-07-22
  Administered 2016-07-22: 30 mg via INTRAVENOUS
  Administered 2016-07-22: 20 mg via INTRAVENOUS

## 2016-07-22 NOTE — Anesthesia Post-op Follow-up Note (Cosign Needed)
Anesthesia QCDR form completed.        

## 2016-07-22 NOTE — Transfer of Care (Signed)
Immediate Anesthesia Transfer of Care Note  Patient: Martin Hardy.  Procedure(s) Performed: Procedure(s): COLONOSCOPY WITH PROPOFOL (N/A)  Patient Location: Endoscopy Unit  Anesthesia Type:General  Level of Consciousness: sedated  Airway & Oxygen Therapy: Patient connected to nasal cannula oxygen  Post-op Assessment: Post -op Vital signs reviewed and stable  Post vital signs: stable  Last Vitals:  Vitals:   07/22/16 1218 07/22/16 1324  BP: (!) 168/81 116/61  Pulse:  62  Resp:  13  Temp:  36.3 C    Last Pain:  Vitals:   07/22/16 1324  TempSrc: Tympanic         Complications: No apparent anesthesia complications

## 2016-07-22 NOTE — Anesthesia Postprocedure Evaluation (Signed)
Anesthesia Post Note  Patient: Martin Hardy.  Procedure(s) Performed: Procedure(s) (LRB): COLONOSCOPY WITH PROPOFOL (N/A)  Patient location during evaluation: PACU Anesthesia Type: General Level of consciousness: awake Pain management: pain level controlled Vital Signs Assessment: post-procedure vital signs reviewed and stable Respiratory status: spontaneous breathing Cardiovascular status: stable Anesthetic complications: no     Last Vitals:  Vitals:   07/22/16 1324 07/22/16 1334  BP: 116/61 119/72  Pulse: 62 66  Resp: 13 20  Temp: 36.3 C     Last Pain:  Vitals:   07/22/16 1324  TempSrc: Tympanic                 VAN STAVEREN,Shaye Elling

## 2016-07-22 NOTE — Anesthesia Preprocedure Evaluation (Signed)
Anesthesia Evaluation  Patient identified by MRN, date of birth, ID band Patient awake    Reviewed: Allergy & Precautions, NPO status , Patient's Chart, lab work & pertinent test results  Airway Mallampati: II       Dental  (+) Teeth Intact   Pulmonary sleep apnea ,    breath sounds clear to auscultation       Cardiovascular hypertension, Pt. on home beta blockers + Peripheral Vascular Disease and +CHF  + Cardiac Defibrillator  Rhythm:Regular     Neuro/Psych    GI/Hepatic Neg liver ROS, PUD, GERD  Medicated,  Endo/Other    Renal/GU negative Renal ROS     Musculoskeletal   Abdominal Normal abdominal exam  (+)   Peds  Hematology   Anesthesia Other Findings   Reproductive/Obstetrics                             Anesthesia Physical Anesthesia Plan  ASA: III  Anesthesia Plan: General   Post-op Pain Management:    Induction: Intravenous  Airway Management Planned: Natural Airway and Nasal Cannula  Additional Equipment:   Intra-op Plan:   Post-operative Plan:   Informed Consent: I have reviewed the patients History and Physical, chart, labs and discussed the procedure including the risks, benefits and alternatives for the proposed anesthesia with the patient or authorized representative who has indicated his/her understanding and acceptance.     Plan Discussed with: CRNA  Anesthesia Plan Comments:         Anesthesia Quick Evaluation

## 2016-07-22 NOTE — H&P (Signed)
Primary Care Physician:  [name], MD Primary Gastroenterologist:  Dr. Vira Agar  Pre-Procedure History & Physical: HPI:  Martin Hardy. is a 71 y.o. male is here for an colonoscopy.   Past Medical History:  Diagnosis Date  . AICD (automatic cardioverter/defibrillator) present   . Alcohol abuse   . Bladder tumor   . BPH (benign prostatic hyperplasia)   . Cancer [facility]) 2012   Polyp resected colonoscopy  . Cardiomyopathy ([place])   . CHF (congestive heart failure) (HCC)    CHRONIC  . Depression   . Diverticulosis   . Dysrhythmia   . Enlarged heart   . Enlarged heart   . Epididymal mass   . GERD (gastroesophageal reflux disease)   . History of bleeding peptic ulcer   . History of blood transfusion   . Hx MRSA infection   . Hypertension   . Obesity   . Overweight   . Presence of permanent cardiac pacemaker   . PUD (peptic ulcer disease)   . Scrotal cyst   . Sleep apnea    cpap  . Spermatocele   . Urinary retention     Past Surgical History:  Procedure Laterality Date  . CATARACT EXTRACTION, BILATERAL    . EP IMPLANTABLE DEVICE    . EYE SURGERY    . FOOT NEUROMA SURGERY    . gastric ulcer    . GREEN LIGHT LASER TURP (TRANSURETHRAL RESECTION OF PROSTATE N/A 09/24/2014   Procedure: GREEN LIGHT LASER TURP (TRANSURETHRAL RESECTION OF PROSTATE;  Surgeon: Collier Flowers, MD;  Location: [facility] ORS;  Service: Urology;  Laterality: N/A;  . HERNIA REPAIR    . SHOULDER ARTHROSCOPY WITH OPEN ROTATOR CUFF REPAIR Left 06/13/2015   Procedure: SHOULDER ARTHROSCOPIC debridement and decompression and repair of rotator cuff tear;  Surgeon: Corky Mull, MD;  Location: [facility] ORS;  Service: Orthopedics;  Laterality: Left;  . SHOULDER ARTHROSCOPY WITH OPEN ROTATOR CUFF REPAIR Left 01/09/2016   Procedure: SHOULDER ARTHROSCOPY, DEBRIDEMENT AND WITH OPEN ROTATOR CUFF REPAIR;  Surgeon: Corky Mull, MD;  Location: [facility] ORS;  Service: Orthopedics;  Laterality: Left;  . TRIGGER FINGER  RELEASE Right 05/13/2016   Procedure: RELEASE TRIGGER FINGER right ring finger;  Surgeon: Corky Mull, MD;  Location: [place];  Service: Orthopedics;  Laterality: Right;  sleep apnea  . VAGOTOMY      Prior to Admission medications   Medication Sig Start Date End Date Taking? Authorizing Provider  aspirin EC 81 MG tablet Take by mouth.    Historical Provider, MD  carvedilol (COREG) 25 MG tablet Take 25 mg by mouth 2 (two) times daily with a meal.    Historical Provider, MD  hydrochlorothiazide (HYDRODIURIL) 25 MG tablet Take 25 mg by mouth daily.    Historical Provider, MD  lisinopril (PRINIVIL,ZESTRIL) 40 MG tablet Take 40 mg by mouth daily.     Historical Provider, MD  omeprazole (PRILOSEC) 20 MG capsule Take 20 mg by mouth 2 (two) times daily before a meal. pm     Historical Provider, MD  oxyCODONE (ROXICODONE) 5 MG immediate release tablet Take 1-2 tablets (5-10 mg total) by mouth every 4 (four) hours as needed for severe pain. 05/13/16   Corky Mull, MD  tamsulosin (FLOMAX) 0.4 MG CAPS capsule TAKE 1 CAPSULE BY MOUTH EVERY DAY 10/24/14   Historical Provider, MD  venlafaxine (EFFEXOR) 75 MG tablet Take 75 mg by mouth every morning. pm     Historical Provider, MD  Allergies as of 06/23/2016  . (No Known Allergies)    Family History  Problem Relation Age of Onset  . COPD Mother   . Hypertension Mother     Father  . Prostate cancer Neg Hx     Social History   Social History  . Marital status: Married    Spouse name: N/A  . Number of children: N/A  . Years of education: N/A   Occupational History  . Not on file.   Social History Main Topics  . Smoking status: Never Smoker  . Smokeless tobacco: Former Systems developer    Types: Snuff  . Alcohol use 23.4 oz/week    39 Cans of beer per week     Comment:    . Drug use: No  . Sexual activity: Yes   Other Topics Concern  . Not on file   Social History Narrative  . No narrative on file    Review of Systems: See HPI,  otherwise negative ROS  Physical Exam: BP (!) 168/81   Pulse (!) 54   Temp (!) 96.8 F (36 C) (Tympanic)   Resp 17   Ht 5\' 9"  (1.753 m)   Wt 98.9 kg (218 lb)   SpO2 100%   BMI 32.19 kg/m  General:   Alert,  pleasant and cooperative in NAD Head:  Normocephalic and atraumatic. Neck:  Supple; no masses or thyromegaly. Lungs:  Clear throughout to auscultation.    Heart:  Regular rate and rhythm. Abdomen:  Soft, nontender and nondistended. Normal bowel sounds, without guarding, and without rebound.   Neurologic:  Alert and  oriented x4;  grossly normal neurologically.  Impression/Plan: [name]. is here for an colonoscopy to be performed for follow up carcinoid tumor  Risks, benefits, limitations, and alternatives regarding  colonoscopy have been reviewed with the patient.  Questions have been answered.  All parties agreeable.   Gaylyn Cheers, MD  07/22/2016, 12:48 PM

## 2016-07-22 NOTE — Op Note (Signed)
[facility] Patient Name: Martin Hardy Procedure Date: 07/22/2016 12:37 PM MRN: [id number] Account #: [id number] Date of Birth: [birth year] Admit Type: Outpatient Age: 71 Room: [facility] ENDO ROOM 4 Gender: Male Note Status: Finalized Procedure:            Colonoscopy Indications:          High risk colon cancer surveillance: Personal history                        of colonic polyps Providers:            Manya Silvas, MD Referring MD:         Adrian Prows (Referring MD) Medicines:            Propofol per Anesthesia Complications:        No immediate complications. Procedure:            Pre-Anesthesia Assessment:                       - After reviewing the risks and benefits, the patient                        was deemed in satisfactory condition to undergo the                        procedure.                       After obtaining informed consent, the colonoscope was                        passed under direct vision. Throughout the procedure,                        the patient's blood pressure, pulse, and oxygen                        saturations were monitored continuously. The                        Colonoscope was introduced through the anus and                        advanced to the the cecum, identified by appendiceal                        orifice and ileocecal valve. The colonoscopy was                        somewhat difficult due to a tortuous colon. Successful                        completion of the procedure was aided by applying                        abdominal pressure. The patient tolerated the procedure                        well. The quality of the bowel preparation was adequate  to identify polyps. Findings:      Multiple small-mouthed diverticula were found in the sigmoid colon,       descending colon, transverse colon and ascending colon.      Internal hemorrhoids were found during endoscopy. The  hemorrhoids were       small and Grade I (internal hemorrhoids that do not prolapse).      The exam was otherwise without abnormality. Impression:           - Diverticulosis in the sigmoid colon, in the                        descending colon, in the transverse colon and in the                        ascending colon.                       - Internal hemorrhoids.                       - The examination was otherwise normal.                       - No specimens collected. Recommendation:       - Repeat colonoscopy in 5 years for surveillance. Manya Silvas, MD 07/22/2016 1:23:29 PM This report has been signed electronically. Number of Addenda: 0 Note Initiated On: 07/22/2016 12:37 PM Scope Withdrawal Time: 0 hours 12 minutes 34 seconds  Total Procedure Duration: 0 hours 24 minutes 53 seconds       [facility]

## 2016-07-23 ENCOUNTER — Encounter: Payer: Self-pay | Admitting: Unknown Physician Specialty

## 2016-09-23 ENCOUNTER — Telehealth: Payer: Self-pay

## 2016-09-23 NOTE — Telephone Encounter (Signed)
Pt called stating he performs CIC and needs a new script for his catheters. Made pt aware he has not been seen since 2016, therefore a new script could not be given until he is seen. Pt voiced understanding. OV appt was made for first available.

## 2016-10-02 ENCOUNTER — Encounter: Payer: Self-pay | Admitting: Urology

## 2016-10-02 ENCOUNTER — Ambulatory Visit (INDEPENDENT_AMBULATORY_CARE_PROVIDER_SITE_OTHER): Payer: Medicare Other | Admitting: Urology

## 2016-10-02 VITALS — BP 152/79 | HR 56 | Ht 69.0 in | Wt 215.0 lb

## 2016-10-02 DIAGNOSIS — F1010 Alcohol abuse, uncomplicated: Secondary | ICD-10-CM | POA: Diagnosis not present

## 2016-10-02 DIAGNOSIS — N400 Enlarged prostate without lower urinary tract symptoms: Secondary | ICD-10-CM

## 2016-10-02 DIAGNOSIS — R339 Retention of urine, unspecified: Secondary | ICD-10-CM | POA: Diagnosis not present

## 2016-10-02 LAB — BLADDER SCAN AMB NON-IMAGING

## 2016-10-02 NOTE — Progress Notes (Signed)
10/02/2016 1:50 PM   Martin Hardy [birth year] [id number]  Referring provider: [name], MD [place], [place] [address]  Chief Complaint  Patient presents with  . Benign Prostatic Hypertrophy    Needs RX for Catheters    HPI: 71 year old male who presents today for routine urologic follow-up.  He is a history of BPH and previously underwent greenlight laser ablation of the prostate with Dr. [name] in 2016. Following this procedure, he reports he had dramatic improvement in his urinary stream and is generally satisfied with his voiding symptoms.  He denies any UTIs, dysuria, gross hematuria, weak stream, sensation of incomplete bladder emptying. Nocturia 1.  He does "enjoy" alcoholic beverages on a daily basis and reports that after having 4-6 beers in the evenings, he occasionally passes out and wakes up unable to void. On these occasions, the only thing allowing him to empty his bladder is self cathetering. He has to do this 3-4 times weekly and this situation only.  He previously took Flomax but has not been taking aspirin the past several months. He has noted no difference in his urinary symptoms or issues with urinary retention off of this medication.    PMH: Past Medical History:  Diagnosis Date  . AICD (automatic cardioverter/defibrillator) present   . Alcohol abuse   . Bladder tumor   . BPH (benign prostatic hyperplasia)   . Cancer [facility]) 2012   Polyp resected colonoscopy  . Cardiomyopathy ([place])   . CHF (congestive heart failure) (HCC)    CHRONIC  . Depression   . Diverticulosis   . Dysrhythmia   . Enlarged heart   . Enlarged heart   . Epididymal mass   . GERD (gastroesophageal reflux disease)   . History of bleeding peptic ulcer   . History of blood transfusion   . Hx MRSA infection   . Hypertension   . Obesity   . Overweight   . Presence of permanent cardiac pacemaker   . PUD (peptic ulcer disease)   . Scrotal cyst   .  Sleep apnea    cpap  . Spermatocele   . Urinary retention     Surgical History: Past Surgical History:  Procedure Laterality Date  . CATARACT EXTRACTION, BILATERAL    . COLONOSCOPY WITH PROPOFOL N/A 07/22/2016   Procedure: COLONOSCOPY WITH PROPOFOL;  Surgeon: Manya Silvas, MD;  [facility] ENDOSCOPY;  Service: Endoscopy;  Laterality: N/A;  . EP IMPLANTABLE DEVICE    . EYE SURGERY    . FOOT NEUROMA SURGERY    . gastric ulcer    . GREEN LIGHT LASER TURP (TRANSURETHRAL RESECTION OF PROSTATE N/A 09/24/2014   Procedure: GREEN LIGHT LASER TURP (TRANSURETHRAL RESECTION OF PROSTATE;  Surgeon: Collier Flowers, MD;  Location: [facility] ORS;  Service: Urology;  Laterality: N/A;  . HERNIA REPAIR    . SHOULDER ARTHROSCOPY WITH OPEN ROTATOR CUFF REPAIR Left 06/13/2015   Procedure: SHOULDER ARTHROSCOPIC debridement and decompression and repair of rotator cuff tear;  Surgeon: Corky Mull, MD;  Location: [facility] ORS;  Service: Orthopedics;  Laterality: Left;  . SHOULDER ARTHROSCOPY WITH OPEN ROTATOR CUFF REPAIR Left 01/09/2016   Procedure: SHOULDER ARTHROSCOPY, DEBRIDEMENT AND WITH OPEN ROTATOR CUFF REPAIR;  Surgeon: Corky Mull, MD;  Location: [facility] ORS;  Service: Orthopedics;  Laterality: Left;  . TRIGGER FINGER RELEASE Right 05/13/2016   Procedure: RELEASE TRIGGER FINGER right ring finger;  Surgeon: Corky Mull, MD;  Location: [place];  Service:  Orthopedics;  Laterality: Right;  sleep apnea  . VAGOTOMY      Home Medications:  Allergies as of 10/02/2016   No Known Allergies     Medication List       Accurate as of 10/02/16 11:59 PM. Always use your most recent med list.          aspirin EC 81 MG tablet Take by mouth.   carvedilol 25 MG tablet Commonly known as:  COREG Take 25 mg by mouth 2 (two) times daily with a meal.   hydrochlorothiazide 25 MG tablet Commonly known as:  HYDRODIURIL Take 25 mg by mouth daily.   lisinopril 40 MG tablet Commonly known as:   PRINIVIL,ZESTRIL Take 40 mg by mouth daily.   omeprazole 20 MG capsule Commonly known as:  PRILOSEC Take 20 mg by mouth 2 (two) times daily before a meal. pm   venlafaxine 75 MG tablet Commonly known as:  EFFEXOR Take 75 mg by mouth every morning. pm       Allergies: No Known Allergies  Family History: Family History  Problem Relation Age of Onset  . COPD Mother   . Hypertension Mother        Father  . Prostate cancer Neg Hx     Social History:  reports that he has never smoked. He has quit using smokeless tobacco. His smokeless tobacco use included Snuff. He reports that he drinks about 23.4 oz of alcohol per week . He reports that he does not use drugs.  ROS: UROLOGY Frequent Urination?: No Hard to postpone urination?: No Burning/pain with urination?: No Get up at night to urinate?: No Leakage of urine?: No Urine stream starts and stops?: No Trouble starting stream?: Yes Do you have to strain to urinate?: No Blood in urine?: No Urinary tract infection?: No Sexually transmitted disease?: No Injury to kidneys or bladder?: No Painful intercourse?: No Weak stream?: No Erection problems?: No Penile pain?: No  Gastrointestinal Nausea?: No Vomiting?: No Indigestion/heartburn?: No Diarrhea?: No Constipation?: No  Constitutional Fever: No Night sweats?: No Weight loss?: No Fatigue?: No  Skin Skin rash/lesions?: No Itching?: No  Eyes Blurred vision?: No Double vision?: No  Ears/Nose/Throat Sore throat?: No Sinus problems?: No  Hematologic/Lymphatic Swollen glands?: No Easy bruising?: No  Cardiovascular Leg swelling?: No Chest pain?: Yes  Respiratory Cough?: No Shortness of breath?: No  Endocrine Excessive thirst?: No  Musculoskeletal Back pain?: No Joint pain?: No  Neurological Headaches?: No Dizziness?: No  Psychologic Depression?: No Anxiety?: No  Physical Exam: BP (!) 152/79 (BP Location: Right Arm, Patient Position:  Sitting, Cuff Size: Normal)   Pulse (!) 56   Ht 5\' 9"  (1.753 m)   Wt 215 lb (97.5 kg)   BMI 31.75 kg/m   Constitutional:  Alert and oriented, No acute distress. HEENT: [place] AT, moist mucus membranes.  Trachea midline, no masses. Cardiovascular: No clubbing, cyanosis, or edema. Respiratory: Normal respiratory effort, no increased work of breathing. GI: Abdomen is soft, nontender, nondistended, no abdominal masses GU: No CVA tenderness.  Skin: No rashes, bruises or suspicious lesions. Neurologic: Grossly intact, no focal deficits, moving all 4 extremities. Psychiatric: Normal mood and affect.  Laboratory Data: Lab Results  Component Value Date   WBC 7.0 12/31/2015   HGB 17.7 (H) 01/09/2016   HCT 52.0 01/09/2016   MCV 86.4 12/31/2015   PLT 108 (L) 12/31/2015    Lab Results  Component Value Date   CREATININE 0.97 06/11/2015   Urinalysis Results for orders placed  or performed in visit on 10/02/16  Microscopic Examination  Result Value Ref Range   WBC, UA 6-10 (A) 0 - 5 /hpf   RBC, UA None seen 0 - 2 /hpf   Epithelial Cells (non renal) 0-10 0 - 10 /hpf   Mucus, UA Present (A) Not Estab.   Bacteria, UA Few (A) None seen/Few  Urinalysis, Complete  Result Value Ref Range   Specific Gravity, UA 1.015 1.005 - 1.030   pH, UA 8.5 (H) 5.0 - 7.5   Color, UA Yellow Yellow   Appearance Ur Cloudy (A) Clear   Leukocytes, UA 2+ (A) Negative   Protein, UA Negative Negative/Trace   Glucose, UA Negative Negative   Ketones, UA Negative Negative   RBC, UA Negative Negative   Bilirubin, UA Negative Negative   Urobilinogen, Ur 4.0 (H) 0.2 - 1.0 mg/dL   Nitrite, UA Negative Negative   Microscopic Examination See below:   BLADDER SCAN AMB NON-IMAGING  Result Value Ref Range   Scan Result 87ml     Pertinent Imaging: PVR 0  Assessment & Plan:    1. Benign prostatic hyperplasia without lower urinary tract symptoms Status post greenlight laser ablation 2006 with excellent result He  stopped Flomax due to lack of symptoms other than retention related to intoxication Will defer further PSA screening given patient's age, significant medical comorbidities We discussed consideration of a cystoscopy if he continues to catheterize on a regular basis next year for surveillance, slightly increased risk for bladder cancer secondary to chronic inflammation from self catheter  2. Incomplete bladder emptying PVR today minimal, voids well at baseline - Urinalysis, Complete - BLADDER SCAN AMB NON-IMAGING  3. Alcohol abuse Causative factor for his daily acute retention We'll plan to provide catheters for daily self catheter as needed Preferable solution would be to avoid acute EtOH intoxication on a daily basis, detrimental to his overall health  No Follow-up on file.  Hollice Espy, MD  [facility] [address] [place], [place] [address] [phone]

## 2016-10-03 LAB — MICROSCOPIC EXAMINATION: RBC, UA: NONE SEEN /hpf (ref 0–?)

## 2016-10-03 LAB — URINALYSIS, COMPLETE
BILIRUBIN UA: NEGATIVE
GLUCOSE, UA: NEGATIVE
Ketones, UA: NEGATIVE
Nitrite, UA: NEGATIVE
PH UA: 8.5 — AB (ref 5.0–7.5)
PROTEIN UA: NEGATIVE
RBC UA: NEGATIVE
Specific Gravity, UA: 1.015 (ref 1.005–1.030)
Urobilinogen, Ur: 4 mg/dL — ABNORMAL HIGH (ref 0.2–1.0)

## 2016-11-30 ENCOUNTER — Ambulatory Visit
Admission: RE | Admit: 2016-11-30 | Discharge: 2016-11-30 | Disposition: A | Discharge status: Home / Self Care | Payer: Medicare Other | Source: Ambulatory Visit | Attending: Cardiology | Admitting: Cardiology

## 2016-11-30 ENCOUNTER — Encounter
Admission: RE | Admit: 2016-11-30 | Discharge: 2016-11-30 | Disposition: A | Discharge status: Home / Self Care | Payer: Medicare Other | Source: Ambulatory Visit | Attending: Cardiology | Admitting: Cardiology

## 2016-11-30 ENCOUNTER — Inpatient Hospital Stay: Admission: RE | Admit: 2016-11-30 | Payer: Medicare Other | Source: Ambulatory Visit

## 2016-11-30 DIAGNOSIS — Z8371 Family history of colonic polyps: Secondary | ICD-10-CM | POA: Diagnosis not present

## 2016-11-30 DIAGNOSIS — F329 Major depressive disorder, single episode, unspecified: Secondary | ICD-10-CM | POA: Diagnosis not present

## 2016-11-30 DIAGNOSIS — R55 Syncope and collapse: Secondary | ICD-10-CM | POA: Diagnosis not present

## 2016-11-30 DIAGNOSIS — Z95810 Presence of automatic (implantable) cardiac defibrillator: Secondary | ICD-10-CM | POA: Diagnosis not present

## 2016-11-30 DIAGNOSIS — I429 Cardiomyopathy, unspecified: Secondary | ICD-10-CM | POA: Diagnosis present

## 2016-11-30 DIAGNOSIS — Z79899 Other long term (current) drug therapy: Secondary | ICD-10-CM | POA: Diagnosis not present

## 2016-11-30 DIAGNOSIS — M1990 Unspecified osteoarthritis, unspecified site: Secondary | ICD-10-CM | POA: Diagnosis not present

## 2016-11-30 DIAGNOSIS — K219 Gastro-esophageal reflux disease without esophagitis: Secondary | ICD-10-CM | POA: Diagnosis not present

## 2016-11-30 DIAGNOSIS — I420 Dilated cardiomyopathy: Secondary | ICD-10-CM | POA: Diagnosis not present

## 2016-11-30 DIAGNOSIS — Z7982 Long term (current) use of aspirin: Secondary | ICD-10-CM | POA: Diagnosis not present

## 2016-11-30 DIAGNOSIS — Z836 Family history of other diseases of the respiratory system: Secondary | ICD-10-CM | POA: Diagnosis not present

## 2016-11-30 DIAGNOSIS — Z8249 Family history of ischemic heart disease and other diseases of the circulatory system: Secondary | ICD-10-CM | POA: Diagnosis not present

## 2016-11-30 DIAGNOSIS — Z720 Tobacco use: Secondary | ICD-10-CM | POA: Diagnosis not present

## 2016-11-30 DIAGNOSIS — E785 Hyperlipidemia, unspecified: Secondary | ICD-10-CM | POA: Diagnosis not present

## 2016-11-30 DIAGNOSIS — I5022 Chronic systolic (congestive) heart failure: Secondary | ICD-10-CM | POA: Diagnosis not present

## 2016-11-30 DIAGNOSIS — N281 Cyst of kidney, acquired: Secondary | ICD-10-CM | POA: Diagnosis not present

## 2016-11-30 DIAGNOSIS — I110 Hypertensive heart disease with heart failure: Secondary | ICD-10-CM | POA: Diagnosis not present

## 2016-11-30 DIAGNOSIS — Z45018 Encounter for adjustment and management of other part of cardiac pacemaker: Secondary | ICD-10-CM | POA: Diagnosis present

## 2016-11-30 DIAGNOSIS — G4733 Obstructive sleep apnea (adult) (pediatric): Secondary | ICD-10-CM | POA: Diagnosis not present

## 2016-11-30 DIAGNOSIS — D696 Thrombocytopenia, unspecified: Secondary | ICD-10-CM | POA: Diagnosis not present

## 2016-11-30 DIAGNOSIS — M5432 Sciatica, left side: Secondary | ICD-10-CM | POA: Diagnosis not present

## 2016-11-30 DIAGNOSIS — Z8711 Personal history of peptic ulcer disease: Secondary | ICD-10-CM | POA: Diagnosis not present

## 2016-11-30 DIAGNOSIS — M109 Gout, unspecified: Secondary | ICD-10-CM | POA: Diagnosis not present

## 2016-11-30 DIAGNOSIS — I6523 Occlusion and stenosis of bilateral carotid arteries: Secondary | ICD-10-CM | POA: Diagnosis not present

## 2016-11-30 LAB — BASIC METABOLIC PANEL
ANION GAP: 7 (ref 5–15)
BUN: 11 mg/dL (ref 6–20)
CHLORIDE: 104 mmol/L (ref 101–111)
CO2: 28 mmol/L (ref 22–32)
CREATININE: 0.85 mg/dL (ref 0.61–1.24)
Calcium: 9.3 mg/dL (ref 8.9–10.3)
GFR calc non Af Amer: >60 mL/min (ref >60)
Glucose, Bld: 99 mg/dL (ref 65–99)
Potassium: 3.3 mmol/L — ABNORMAL LOW (ref 3.5–5.1)
SODIUM: 139 mmol/L (ref 135–145)

## 2016-11-30 LAB — DIFFERENTIAL
BASOS ABS: 0.1 10*3/uL (ref 0–0.1)
Basophils Relative: 1 %
Eosinophils Absolute: 0.4 10*3/uL (ref 0–0.7)
Eosinophils Relative: 6 %
LYMPHS ABS: 1.7 10*3/uL (ref 1.0–3.6)
LYMPHS PCT: 23 %
Monocytes Absolute: 0.7 10*3/uL (ref 0.2–1.0)
Monocytes Relative: 10 %
NEUTROS ABS: 4.5 10*3/uL (ref 1.4–6.5)
NEUTROS PCT: 60 %

## 2016-11-30 LAB — CBC
HCT: 49 % (ref 40.0–52.0)
HEMOGLOBIN: 16.3 g/dL (ref 13.0–18.0)
MCH: 28.8 pg (ref 26.0–34.0)
MCHC: 33.3 g/dL (ref 32.0–36.0)
MCV: 86.7 fL (ref 80.0–100.0)
PLATELETS: 133 10*3/uL — AB (ref 150–440)
RBC: 5.65 MIL/uL (ref 4.40–5.90)
RDW: 15 % — ABNORMAL HIGH (ref 11.5–14.5)
WBC: 7.4 10*3/uL (ref 3.8–10.6)

## 2016-11-30 LAB — PROTIME-INR
INR: 1.1
PROTHROMBIN TIME: 14.2 s (ref 11.4–15.2)

## 2016-11-30 LAB — APTT: APTT: 30 s (ref 24–36)

## 2016-11-30 LAB — SURGICAL PCR SCREEN
MRSA, PCR: NEGATIVE
STAPHYLOCOCCUS AUREUS: NEGATIVE

## 2016-11-30 NOTE — Pre-Procedure Instructions (Signed)
Progress Notes - in this encounter  Martin Cowman, MD - 11/23/2016 2:00 PM EDT Formatting of this note may be different from the original. Established Patient Visit   Chief Complaint: Chief Complaint  Patient presents with  . Follow-up  3 MONTHS  Date of Service: 11/23/2016 Date of Birth: [birth year] PCP: DAVID Ashley Akin, MD  History of Present Illness: Mr. Martin Hardy is a 71 y.o.male patient who returns for  1. Nonischemic dilated cardiomyopathy 2. Chronic systolic congestive heart failure 3. 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 Normal coronary anatomy by cardiac catheterization 01/16/2009 5. Essential hypertension 6. Hyperlipidemia 7. Sleep apnea  Patient returns for follow-up, reports doing well from a cardiovascular perspective. He denies chest pain or shortness of breath. He has occasional palpitations with which is chronic and unchanged. He has intermittent peripheral edema left greater than right. The patient is active but does not do any structured exercise. ICD interrogation 10/13/2016 revealed elective replacement indication. 2D echocardiogram on 03/17/2016 revealed mildly reduced left ventricular function with LVEF 45% with moderate mitral regurgitation, mild TR and AR. 2D echocardiogram 03/09/2014 revealed LVEF 35%.  The patient has essential hypertension, blood pressure well controlled, currently on lisinopril, hydrochlorothiazide, and carvedilol, which are well tolerated without apparent side effects.The patient does not follow a low-sodium, no added salt diet.  Past Medical and Surgical History  Past Medical History Past Medical History:  Diagnosis Date  . Abdominal aortic aneurysm 30 to 34 mm in diameter (CMS-HCC)  USS 02/2014 - needs repeat 2018  . Carcinoma of rectum (CMS-HCC) 2012  . Cardiomyopathy (CMS-HCC)  . Carotid artery stenosis  <50% bil on USS 2015  . CHF (congestive heart failure) (CMS-HCC)  . Cyst of left kidney  . Depression, unspecified  .  Diverticulitis  . Duodenal ulcer  BLEEDING  . EtOH dependence (CMS-HCC)  . Fatty liver  USS 02/2014  . Gallstone  USS 02/2014  . Gout  . Hyperlipidemia, unspecified  . Hypertension  . Sciatica neuralgia, left  . Sleep apnea  . Syncope 2014  NML CAROIDS, TELE AND ECHO STABLE  . 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; Knee arthroscopy (04/29/2009); RIGHT LONG TRIGGER FINGER RELEASE (01/17/2003); DEVIATED SEPTUM (1992); HERNIA REPAIR SURGERY (1975); Limited arthroscopic debridement with debridement of labrum, arthroscopic subacromial decompression, and mini-open rotator cuff repair, left shoulder (Left, 06/13/2015); Limited arthroscopic debridment and mini-open rotator cuff repair, left shoulder (Left, 01/09/2016); Release right ring trigger finger (Right, 05/13/2016); Colonoscopy (06/23/2010); Colonoscopy (10/19/2011); egd (08/13/2008, 05/14/2008, 03/03/2005); flexible sigmoidoscopy (07/01/2010); and Colonoscopy (07/22/2016).   Medications and Allergies  Current Medications  Current Outpatient Prescriptions  Medication Sig Dispense Refill  . amLODIPine (NORVASC) 5 MG tablet Take 1 tablet (5 mg total) by mouth once daily. 30 tablet 5  . aspirin 81 MG EC tablet Take 81 mg by mouth once daily.  . carvedilol (COREG) 25 MG tablet TAKE ONE TABLET BY MOUTH TWICE DAILY 60 tablet 5  . colchicine (COLCRYS-BRAND) 0.6 mg tablet Take 2 tablets (1.2mg ) by mouth at first sign of gout flare followed by 1 tablet (0.6mg ) after 1 hour. (Max 1.8mg  within 1 hour) 10 tablet 3  . fluticasone (FLONASE) 50 mcg/actuation nasal spray Place 2 sprays into both nostrils once daily. Reported on 08/15/2015  . hydroCHLOROthiazide (HYDRODIURIL) 25 MG tablet TAKE ONE TABLET EVERY DAY 90 tablet 2  . linaclotide  (  LINZESS) 145 mcg capsule Take 1 capsule (145 mcg total) by mouth once daily. 30 capsule 5  . lisinopril (PRINIVIL,ZESTRIL) 40 MG tablet TAKE ONE TABLET EVERY DAY 90 tablet 3  . LYRICA 150 mg capsule TAKE 1 CAPSULE TWICE DAILY 60 capsule 5  . omeprazole (PRILOSEC) 20 MG DR capsule TAKE 1 CAPSULE BY MOUTH TWICE DAILY 60 capsule 5  . potassium chloride (KLOR-CON) 20 mEq packet Take 1 packet (20 mEq total) by mouth once daily. 10 packet 0  . tamsulosin (FLOMAX) 0.4 mg capsule TAKE 1 CAPSULE EVERY DAY TAKE 30 MIN AFTER SAME MEAL EACH DAY 90 capsule 0  . traZODone (DESYREL) 50 MG tablet ONE-HALF TABLET AT BEDTIME FOR 1 WEEK THEN TAKE ONE TABLET AT BEDTIMEFOR SLEEP 30 tablet 11  . venlafaxine (EFFEXOR-XR) 75 MG XR capsule TAKE 1 CAPSULE EVERY DAY 90 capsule 2   No current facility-administered medications for this visit.   Allergies: Patient has no known allergies.  Social and Family History  Social History reports that he has never smoked. He has quit using smokeless tobacco. His smokeless tobacco use included Snuff. He reports that he does not drink alcohol or use drugs.  Family History Family History  Problem Relation Age of Onset  . COPD Mother  . High blood pressure (Hypertension) Mother  . High blood pressure (Hypertension) Father  . Cancer Father  . Colon polyps Father   Review of Systems   Review of Systems: The patient reports an episode chest pain as described above, with exertional shortness of breath, without orthopnea, paroxysmal nocturnal dyspnea, pedal edema, reports frequent palpitations, heart racing, without presyncope, syncope. Review of 12 Systems is negative except as described above.  Physical Examination   Vitals: BP 136/62  Pulse 70  Ht 177.8 cm (5\' 10" )  Wt 98.9 kg (218 lb)  SpO2 96%  BMI 31.28 kg/m  Ht:177.8 cm (5\' 10" ) Wt:98.9 kg (218 lb) GDJ:MEQA surface area is 2.21 meters squared. Body mass index is 31.28 kg/m.  General: Alert and oriented.  Well-appearing. No acute distress. HEENT: Pupils equally reactive to light and accomodation  Neck: Supple, carotid pulses 2+ Lungs: Normal effort of breathing; clear to auscultation bilaterally; no wheezes, rales, rhonchi Heart: Regular rate and rhythm. 1/6 systolic murmur Abdomen: soft nontender, nondistended, with normal bowel sounds Extremities: no cyanosis, clubbing, or edema Peripheral Pulses: 2+ radial and dorsalis pedis pulses bilaterally Skin: Warm, dry, no diaphoresis  Assessment   71 y.o. male with  1. Essential hypertension  2. Systolic congestive heart failure, unspecified HF chronicity (CMS-HCC)  3. Bilateral carotid artery stenosis  4. Dilated cardiomyopathy (CMS-HCC)  5. Obstructive sleep apnea syndrome  6. Hyperlipidemia, unspecified hyperlipidemia type  7. History of cardiac cath  8. Biventricular ICD (implantable cardioverter-defibrillator) in place   71 year old gentleman with nonischemic dilated cardiomyopathy, chronic systolic congestive heart failure, status post ICD, overall clinically stable with chronic exertional dyspnea.. Patient has essential hypertension, blood pressure well controlled on current BP medications. ICD interrogation reveals elective replacement indication.  Plan   1. Continue current medications 2. Counseled patient about low-sodium diet 3. DASH diet printed instructions given to the patient 4. Counseled patient about low-cholesterol diet 5. Low-fat and cholesterol diet printed instructions given to the patient 6. ICD generator change out 7. Return to clinic after ICD generator change  No orders of the defined types were placed in this encounter.  Return in about 2 weeks (around 12/07/2016), or after ICD change out.  Martin Cowman, MD PhD [facility] [place]

## 2016-11-30 NOTE — Patient Instructions (Signed)
  Your procedure is scheduled on: , 2018. Report to Same Day Surgery.   Remember: Instructions that are not followed completely may result in serious medical risk, up to and including death, or upon the discretion of your surgeon and anesthesiologist your surgery may need to be rescheduled.    _x___ 1. Do not eat food or drink liquids after midnight. No gum chewing or hard candies.     ____ 2. No Alcohol for 24 hours before or after surgery.   ____ 3. Bring all medications with you on the day of surgery if instructed.    __x__ 4. Notify your doctor if there is any change in your medical condition     (cold, fever, infections).    _____ 5. No smoking 24 hours prior to surgery.     Do not wear jewelry, make-up, hairpins, clips or nail polish.  Do not wear lotions, powders, or perfumes.   Do not shave 48 hours prior to surgery. Men may shave face and neck.  Do not bring valuables to the hospital.    [facility] is not responsible for any belongings or valuables.               Contacts, dentures or bridgework may not be worn into surgery.  Leave your suitcase in the car. After surgery it may be brought to your room.  For patients admitted to the hospital, discharge time is determined by your treatment team.   Patients discharged the day of surgery will not be allowed to drive home.    Please read over the following fact sheets that you were given:   [facility] Preparing for Surgery  __x__ Take these medicines the morning of surgery with A SIP OF WATER:    1. carvedilol (COREG)  2. lisinopril (PRINIVIL,ZESTRIL)   3. omeprazole (PRILOSEC)   ____ Fleet Enema (as directed)   _x___ Use CHG Soap as directed on instruction sheet  ____ Use inhalers on the day of surgery and bring to hospital day of surgery  ____ Stop metformin 2 days prior to surgery    ____ Take 1/2 of usual insulin dose the night before surgery and none on the morning of  surgery.   ____ Stop  Coumadin/Plavix/aspirin on does not apply.  ____ Stop Anti-inflammatories such as Advil, Aleve, Ibuprofen, Motrin, Naproxen,  Naprosyn, Goodies powders or aspirin products. OK to take Tylenol.   ____ Stop supplements until after surgery.    ____ Bring C-Pap to the hospital.

## 2016-12-01 NOTE — Pre-Procedure Instructions (Signed)
Received fax from Dr. Saralyn Pilar' office, pt will take Klorcon 40 mg daily x 3 days.

## 2016-12-01 NOTE — Pre-Procedure Instructions (Signed)
Faxed to Dr. Saralyn Pilar office request for potassium supplement for a low potassium result obtained at PAT visit on 11/30/16.  Spoke with Lovey Newcomer at Dr. Saralyn Pilar' office, she will let Dr. Saralyn Pilar know.

## 2016-12-02 MED ORDER — CEFAZOLIN SODIUM-DEXTROSE 1-4 GM/50ML-% IV SOLN
1.0000 g | Freq: Once | INTRAVENOUS | Status: AC
  Administered 2016-12-03: 1 g via INTRAVENOUS

## 2016-12-03 ENCOUNTER — Ambulatory Visit: Payer: Medicare Other | Admitting: Anesthesiology

## 2016-12-03 ENCOUNTER — Ambulatory Visit
Admission: RE | Admit: 2016-12-03 | Discharge: 2016-12-03 | Disposition: A | Discharge status: Home / Self Care | Payer: Medicare Other | Source: Ambulatory Visit | Attending: Cardiology | Admitting: Cardiology

## 2016-12-03 ENCOUNTER — Encounter: Payer: Self-pay | Admitting: *Deleted

## 2016-12-03 ENCOUNTER — Encounter
Admission: RE | Disposition: A | Discharge status: Home / Self Care | Payer: Self-pay | Source: Ambulatory Visit | Attending: Cardiology

## 2016-12-03 DIAGNOSIS — I5022 Chronic systolic (congestive) heart failure: Secondary | ICD-10-CM | POA: Insufficient documentation

## 2016-12-03 DIAGNOSIS — I420 Dilated cardiomyopathy: Secondary | ICD-10-CM | POA: Insufficient documentation

## 2016-12-03 DIAGNOSIS — M1990 Unspecified osteoarthritis, unspecified site: Secondary | ICD-10-CM | POA: Insufficient documentation

## 2016-12-03 DIAGNOSIS — E785 Hyperlipidemia, unspecified: Secondary | ICD-10-CM | POA: Insufficient documentation

## 2016-12-03 DIAGNOSIS — G4733 Obstructive sleep apnea (adult) (pediatric): Secondary | ICD-10-CM | POA: Insufficient documentation

## 2016-12-03 DIAGNOSIS — M109 Gout, unspecified: Secondary | ICD-10-CM | POA: Insufficient documentation

## 2016-12-03 DIAGNOSIS — Z45018 Encounter for adjustment and management of other part of cardiac pacemaker: Secondary | ICD-10-CM | POA: Diagnosis not present

## 2016-12-03 DIAGNOSIS — I110 Hypertensive heart disease with heart failure: Secondary | ICD-10-CM | POA: Insufficient documentation

## 2016-12-03 DIAGNOSIS — I6523 Occlusion and stenosis of bilateral carotid arteries: Secondary | ICD-10-CM | POA: Insufficient documentation

## 2016-12-03 DIAGNOSIS — Z8711 Personal history of peptic ulcer disease: Secondary | ICD-10-CM | POA: Insufficient documentation

## 2016-12-03 DIAGNOSIS — M5432 Sciatica, left side: Secondary | ICD-10-CM | POA: Insufficient documentation

## 2016-12-03 DIAGNOSIS — F329 Major depressive disorder, single episode, unspecified: Secondary | ICD-10-CM | POA: Insufficient documentation

## 2016-12-03 DIAGNOSIS — K219 Gastro-esophageal reflux disease without esophagitis: Secondary | ICD-10-CM | POA: Insufficient documentation

## 2016-12-03 DIAGNOSIS — Z720 Tobacco use: Secondary | ICD-10-CM | POA: Insufficient documentation

## 2016-12-03 DIAGNOSIS — N281 Cyst of kidney, acquired: Secondary | ICD-10-CM | POA: Insufficient documentation

## 2016-12-03 DIAGNOSIS — Z836 Family history of other diseases of the respiratory system: Secondary | ICD-10-CM | POA: Insufficient documentation

## 2016-12-03 DIAGNOSIS — D696 Thrombocytopenia, unspecified: Secondary | ICD-10-CM | POA: Insufficient documentation

## 2016-12-03 DIAGNOSIS — Z8249 Family history of ischemic heart disease and other diseases of the circulatory system: Secondary | ICD-10-CM | POA: Insufficient documentation

## 2016-12-03 DIAGNOSIS — Z7982 Long term (current) use of aspirin: Secondary | ICD-10-CM | POA: Insufficient documentation

## 2016-12-03 DIAGNOSIS — Z79899 Other long term (current) drug therapy: Secondary | ICD-10-CM | POA: Insufficient documentation

## 2016-12-03 DIAGNOSIS — R55 Syncope and collapse: Secondary | ICD-10-CM | POA: Insufficient documentation

## 2016-12-03 DIAGNOSIS — Z8371 Family history of colonic polyps: Secondary | ICD-10-CM | POA: Insufficient documentation

## 2016-12-03 HISTORY — DX: Unspecified malignant neoplasm of skin, unspecified: C44.90

## 2016-12-03 HISTORY — PX: IMPLANTABLE CARDIOVERTER DEFIBRILLATOR (ICD) GENERATOR CHANGE: SHX5469

## 2016-12-03 LAB — POCT I-STAT 4, (NA,K, GLUC, HGB,HCT)
Glucose, Bld: 119 mg/dL — ABNORMAL HIGH (ref 65–99)
HEMATOCRIT: 50 % (ref 39.0–52.0)
HEMOGLOBIN: 17 g/dL (ref 13.0–17.0)
Potassium: 3.9 mmol/L (ref 3.5–5.1)
Sodium: 141 mmol/L (ref 135–145)

## 2016-12-03 SURGERY — ICD GENERATOR CHANGE
Anesthesia: Monitor Anesthesia Care

## 2016-12-03 MED ORDER — PROPOFOL 500 MG/50ML IV EMUL
INTRAVENOUS | Status: DC | PRN
  Administered 2016-12-03: 100 ug/kg/min via INTRAVENOUS

## 2016-12-03 MED ORDER — CEFAZOLIN SODIUM-DEXTROSE 1-4 GM/50ML-% IV SOLN
INTRAVENOUS | Status: AC
  Filled 2016-12-03: qty 50

## 2016-12-03 MED ORDER — FENTANYL CITRATE (PF) 100 MCG/2ML IJ SOLN
INTRAMUSCULAR | Status: DC | PRN
  Administered 2016-12-03 (×2): 25 ug via INTRAVENOUS

## 2016-12-03 MED ORDER — MIDAZOLAM HCL 2 MG/2ML IJ SOLN
INTRAMUSCULAR | Status: DC | PRN
  Administered 2016-12-03: 2 mg via INTRAVENOUS

## 2016-12-03 MED ORDER — CEPHALEXIN 250 MG PO CAPS: 250.0000 mg | ORAL_CAPSULE | Freq: Four times a day (QID) | ORAL | 0 refills | Status: DC

## 2016-12-03 MED ORDER — FENTANYL CITRATE (PF) 100 MCG/2ML IJ SOLN
INTRAMUSCULAR | Status: AC
  Filled 2016-12-03: qty 2

## 2016-12-03 MED ORDER — LIDOCAINE 1 % OPTIME INJ - NO CHARGE
INTRAMUSCULAR | Status: DC | PRN
  Administered 2016-12-03: 30 mL

## 2016-12-03 MED ORDER — PROPOFOL 500 MG/50ML IV EMUL
INTRAVENOUS | Status: AC
  Filled 2016-12-03: qty 50

## 2016-12-03 MED ORDER — PROPOFOL 10 MG/ML IV BOLUS
INTRAVENOUS | Status: DC | PRN
  Administered 2016-12-03: 40 mg via INTRAVENOUS
  Administered 2016-12-03: 10 mg via INTRAVENOUS

## 2016-12-03 MED ORDER — ONDANSETRON HCL 4 MG/2ML IJ SOLN: 4.0000 mg | Freq: Once | INTRAMUSCULAR | Status: DC | PRN

## 2016-12-03 MED ORDER — SODIUM CHLORIDE 0.9 % IR SOLN
Freq: Once | Status: AC
  Administered 2016-12-03: 14:00:00
  Filled 2016-12-03: qty 2

## 2016-12-03 MED ORDER — FENTANYL CITRATE (PF) 100 MCG/2ML IJ SOLN: 25.0000 ug | INTRAMUSCULAR | Status: DC | PRN

## 2016-12-03 MED ORDER — MIDAZOLAM HCL 2 MG/2ML IJ SOLN
INTRAMUSCULAR | Status: AC
  Filled 2016-12-03: qty 2

## 2016-12-03 MED ORDER — LACTATED RINGERS IV SOLN
INTRAVENOUS | Status: DC
  Administered 2016-12-03: 13:00:00 via INTRAVENOUS

## 2016-12-03 SURGICAL SUPPLY — 51 items
BAG DECANTER FOR FLEXI CONT (MISCELLANEOUS) IMPLANT
BLADE PHOTON ILLUMINATED (MISCELLANEOUS) ×2 IMPLANT
BLADE SURG SZ10 CARB STEEL (BLADE) ×2 IMPLANT
CANISTER SUCT 1200ML W/VALVE (MISCELLANEOUS) ×2 IMPLANT
CHLORAPREP W/TINT 26ML (MISCELLANEOUS) ×2 IMPLANT
COVER LIGHT HANDLE STERIS (MISCELLANEOUS) ×4 IMPLANT
DECANTER SPIKE VIAL GLASS SM (MISCELLANEOUS) ×2 IMPLANT
DRAPE C-ARM XRAY 36X54 (DRAPES) IMPLANT
DRAPE INCISE IOBAN 66X45 STRL (DRAPES) ×2 IMPLANT
DRAPE LAPAROTOMY 77X122 PED (DRAPES) ×2 IMPLANT
DRSG TEGADERM 4X4.75 (GAUZE/BANDAGES/DRESSINGS) ×2 IMPLANT
DRSG TEGADERM 6X8 (GAUZE/BANDAGES/DRESSINGS) ×2 IMPLANT
DRSG TELFA 4X3 1S NADH ST (GAUZE/BANDAGES/DRESSINGS) ×2 IMPLANT
ELECT REM PT RETURN 9FT ADLT (ELECTROSURGICAL) ×2
ELECTRODE REM PT RTRN 9FT ADLT (ELECTROSURGICAL) ×1 IMPLANT
GLOVE BIO SURGEON STRL SZ8 (GLOVE) ×2 IMPLANT
GLOVE BIOGEL PI IND STRL 7.0 (GLOVE) ×1 IMPLANT
GLOVE BIOGEL PI INDICATOR 7.0 (GLOVE) ×1
GLOVE SKINSENSE NS SZ6.5 (GLOVE)
GLOVE SKINSENSE STRL SZ6.5 (GLOVE) IMPLANT
GOWN STRL REUS W/ TWL LRG LVL3 (GOWN DISPOSABLE) ×1 IMPLANT
GOWN STRL REUS W/ TWL XL LVL3 (GOWN DISPOSABLE) ×1 IMPLANT
GOWN STRL REUS W/TWL LRG LVL3 (GOWN DISPOSABLE) ×1
GOWN STRL REUS W/TWL XL LVL3 (GOWN DISPOSABLE) ×1
GRADUATE 1200CC STRL 31836 (MISCELLANEOUS) ×2 IMPLANT
ICD EVERA XT MRI DF1  DDMB1D1 (ICD Generator) ×1 IMPLANT
ICD EVERA XT MRI DF1 DDMB1D1 (ICD Generator) ×1 IMPLANT
IV NS 1000ML (IV SOLUTION) ×1
IV NS 1000ML BAXH (IV SOLUTION) ×1 IMPLANT
KIT RM TURNOVER STRD PROC AR (KITS) ×2 IMPLANT
KIT WRENCH (KITS) ×2 IMPLANT
NEEDLE FILTER BLUNT 18X 1/2SAF (NEEDLE)
NEEDLE FILTER BLUNT 18X1 1/2 (NEEDLE) IMPLANT
NEEDLE HYPO 25X1 1.5 SAFETY (NEEDLE) ×2 IMPLANT
NEEDLE SPNL 22GX3.5 QUINCKE BK (NEEDLE) ×2 IMPLANT
NS IRRIG 500ML POUR BTL (IV SOLUTION) IMPLANT
PACK BASIN MINOR ARMC (MISCELLANEOUS) IMPLANT
PACK PACE INSERTION (MISCELLANEOUS) ×2 IMPLANT
PAD STATPAD (MISCELLANEOUS) ×2 IMPLANT
SLING ARM XL TX990206 (SOFTGOODS) ×2 IMPLANT
STRAP SAFETY BODY (MISCELLANEOUS) IMPLANT
STRIP CLOSURE SKIN 1/2X4 (GAUZE/BANDAGES/DRESSINGS) IMPLANT
SUT SILK 2 0 SH (SUTURE) ×2 IMPLANT
SUT VIC AB 2-0 CT1 27 (SUTURE)
SUT VIC AB 2-0 CT1 TAPERPNT 27 (SUTURE) IMPLANT
SUT VIC AB 2-0 CT2 27 (SUTURE) ×2 IMPLANT
SUT VIC AB 3-0 PS2 18 (SUTURE) IMPLANT
SUT VIC AB 4-0 PS2 18 (SUTURE) ×2 IMPLANT
SYR BULB IRRIG 60ML STRL (SYRINGE) ×2 IMPLANT
SYR CONTROL 10ML (SYRINGE) ×2 IMPLANT
SYRINGE 10CC LL (SYRINGE) IMPLANT

## 2016-12-03 NOTE — Transfer of Care (Signed)
Immediate Anesthesia Transfer of Care Note  Patient: Martin Hardy.  Procedure(s) Performed: Procedure(s) with comments: BIV ICD GENERATOR CHANGE (N/A) - ICD  generator discarded per protocall  Patient Location: PACU  Anesthesia Type:MAC  Level of Consciousness: awake, alert  and oriented  Airway & Oxygen Therapy: Patient connected to face mask oxygen  Post-op Assessment: Post -op Vital signs reviewed and stable  Post vital signs: stable  Last Vitals:  Vitals:   12/03/16 1222 12/03/16 1448  BP: (!) 159/82 112/60  Pulse: (!) 58   Resp: 18 18  Temp: 36.9 C     Last Pain:  Vitals:   12/03/16 1222  TempSrc: Tympanic         Complications: No apparent anesthesia complications

## 2016-12-03 NOTE — Anesthesia Post-op Follow-up Note (Cosign Needed)
Anesthesia QCDR form completed.        

## 2016-12-03 NOTE — Op Note (Signed)
[facility] Cardiology   12/03/2016                     2:45 PM  PATIENT:  Martin Hardy.    PRE-OPERATIVE DIAGNOSIS:  CARDIOMYOPATHY  POST-OPERATIVE DIAGNOSIS:  Same  PROCEDURE:  BIV ICD GENERATOR CHANGE  SURGEON:  [name], MD    ANESTHESIA:     PREOPERATIVE INDICATIONS:  [name]. is a  71 y.o. male with a diagnosis of CARDIOMYOPATHY who failed conservative measures and elected for surgical management.    The risks benefits and alternatives were discussed with the patient preoperatively including but not limited to the risks of infection, bleeding, cardiopulmonary complications, the need for revision surgery, among others, and the patient was willing to proceed.   OPERATIVE PROCEDURE: The patient was brought to the operating room fasting state. The left pectoral region was prepped and draped in usual sterile manner. Anesthesia was obtained 1% lidocaine locally. A 6 cm incision was performed to the left pectoral region. The old ICD generator was retrieved by electrocautery and blunt dissection. The leads were disconnected and connected to a new dual-chamber  IDC generator (Medtronic [id number]). ICD pocket was irrigated with gentamicin solution. The ICD generator generator was positioned into the pocket and the pocket was closed with 2-0 and 4-0 Vicryl, respectively. Steri-Strips and a pressure dressing were applied.   ICD Criteria  Current LVEF:45%. Within 12 months prior to implant: Yes   Heart failure history: Yes, Class II  Cardiomyopathy history: Yes, Non-Ischemic Cardiomyopathy.  Atrial Fibrillation/Atrial Flutter: No.  Ventricular tachycardia history: No.  Cardiac arrest history: No.  History of syndromes with risk of sudden death: No.  Previous ICD: No.  Current ICD indication: Primary  PPM indication: Yes. Pacing type: Both. Less than 40% RV pacing  requirement anticipated. Indication: Sick Sinus Syndrome   Class I or II Bradycardia indication present: Yes  Beta Blocker therapy for 3 or more months: Yes, prescribed.   Ace Inhibitor/ARB therapy for 3 or more months: Yes, prescribed.

## 2016-12-03 NOTE — Discharge Instructions (Signed)
AMBULATORY SURGERY  DISCHARGE INSTRUCTIONS   1) The drugs that you were given will stay in your system until tomorrow so for the next 24 hours you should not:  A) Drive an automobile B) Make any legal decisions C) Drink any alcoholic beverage   2) You may resume regular meals tomorrow.  Today it is better to start with liquids and gradually work up to solid foods.  You may eat anything you prefer, but it is better to start with liquids, then soup and crackers, and gradually work up to solid foods.   3) Please notify your doctor immediately if you have any unusual bleeding, trouble breathing, redness and pain at the surgery site, drainage, fever, or pain not relieved by medication.    4) Additional Instructions:        Please contact your physician with any problems or Same Day Surgery at [phone], Monday through Friday 6 am to 4 pm, or [place] at [facility] number at [phone].Remove outer dressing on 12/04/2016, leave Steri-Strips on.

## 2016-12-03 NOTE — H&P (Signed)
<6>29299-5<7>Encounter Details<6>46240-8<7>Social History<6>29762-2<7>Last Filed Vital Signs<6>8716-3<7>Instructions<6>69730-0<7>Progress Notes<6>10164-2<7>Plan of Treatment<6>18776-5<7>Visit Diagnoses<6>51848-0<7>/"> Jump to Section ? Document InformationEncounter DetailsInstructionsLast Filed Vital SignsPatient DemographicsPlan of TreatmentProgress NotesReason for VisitSocial HistoryVisit Diagnoses Martin Hardy. Encounter Summary, generated on Jul. 24, 2018 Printout Information  Document Contents Office Visit Document Received Date Jul. 24, 2018 [place]   Patient Demographics - 71 y.o. Male, born 11/09/1945   Patient Address Communication Language Race / Ethnicity  [address] [place], [place] [address] [phone] [facility]) [phone] (Work) [phone] (Mobile) [email or web address] English (Preferred) Dema Severin / Not Hispanic or [place]  Reason for Visit    Reason Comments  Follow-up 3 MONTHS    Encounter Details    Date Type Department Care Team Description  11/23/2016 Office Visit [facility]  [place], [place] [address]  [phone]  [name], [place] Epps  [facility]  [place], [place] [address]  [phone]  [phone] (Fax)  Essential hypertension (Primary Dx);  Systolic congestive heart failure, unspecified HF chronicity (CMS-HCC);  Bilateral carotid artery stenosis;  Dilated cardiomyopathy (CMS-HCC);  Obstructive sleep apnea syndrome;  Hyperlipidemia, unspecified hyperlipidemia type;  History of cardiac cath;  Biventricular ICD (implantable cardioverter-defibrillator) in place   Social History - as of this encounter   Tobacco Use Types Packs/Day Years Used Date  Never Smoker      Smokeless Tobacco: Former Systems developer Snuff     Alcohol Use Drinks/Week oz/Week Comments  No 56 Cans of beer  33.6     Sex  Assigned at [employer] Date Recorded  Not on file    Last Filed Vital Signs - in this encounter   Vital Sign Reading Time Taken  Blood Pressure 136/62 11/23/2016 2:14 PM EDT  Pulse 70 11/23/2016 2:14 PM EDT  Temperature - -  Respiratory Rate - -  Oxygen Saturation 96% 11/23/2016 2:14 PM EDT  Inhaled Oxygen Concentration - -  Weight 98.9 kg (218 lb) 11/23/2016 2:14 PM EDT  Height 177.8 cm (5\' 10" ) 11/23/2016 2:14 PM EDT  Body Mass Index 31.28 11/23/2016 2:14 PM EDT   Instructions - in this encounter   Patient Instructions - [name], MD - 11/23/2016 2:00 PM EDT  Patient Education   DASH Eating Plan DASH stands for "Dietary Approaches to Stop Hypertension." The DASH eating plan is a healthy eating plan that has been shown to reduce high blood pressure (hypertension). It may also reduce your risk for type 2 diabetes, heart disease, and stroke. The DASH eating plan may also help with weight loss. What are tips for following this plan? General guidelines  Avoid eating more than 2,300 mg (milligrams) of salt (sodium) a day. If you have hypertension, you may need to reduce your sodium intake to 1,500 mg a day.  Limit alcohol intake to no more than 1 drink a day for nonpregnant women and 2 drinks a day for men. One drink equals 12 oz of beer, 5 oz of wine, or 1 oz of hard liquor.  Work with your health care provider to maintain a healthy body weight or to lose weight. Ask what an ideal weight is for you.  Get at least 30 minutes of exercise that causes your heart to beat faster (aerobic exercise) most days of the week. Activities may include walking, swimming, or biking.  Work with your health care provider or diet and nutrition specialist (dietitian) to adjust your eating plan to your individual calorie needs. Reading food labels  Check food labels for the amount of sodium per serving. Choose foods with less than 5 percent of the Daily Value of sodium. Generally, foods  with less than 300 mg of sodium per serving fit into this eating plan.  To find whole grains, look for the word "whole" as the first word in the ingredient list. Shopping  Buy products labeled as "low-sodium" or "no salt added."  Buy fresh foods. Avoid canned foods and premade or frozen meals. Cooking  Avoid adding salt when cooking. Use salt-free seasonings or herbs instead of table salt or sea salt. Check with your health care provider or pharmacist before using salt substitutes.  Do not fry foods. Cook foods using healthy methods such as baking, boiling, grilling, and broiling instead.  Cook with heart-healthy oils, such as olive, canola, soybean, or sunflower oil. Meal planning   Eat a balanced diet that includes: ? 5 or more servings of fruits and vegetables each day. At each meal, try to fill half of your plate with fruits and vegetables. ? Up to 6-8 servings of whole grains each day. ? Less than 6 oz of lean meat, poultry, or fish each day. A 3-oz serving of meat is about the same size as a deck of cards. One egg equals 1 oz. ? 2 servings of low-fat dairy each day. ? A serving of nuts, seeds, or beans 5 times each week. ? Heart-healthy fats. Healthy fats called Omega-3 fatty acids are found in foods such as flaxseeds and coldwater fish, like sardines, salmon, and mackerel.  Limit how much you eat of the following: ? Canned or prepackaged foods. ? Food that is high in trans fat, such as fried foods. ? Food that is high in saturated fat, such as fatty meat. ? Sweets, desserts, sugary drinks, and other foods with added sugar. ? Full-fat dairy products.  Do not salt foods before eating.  Try to eat at least 2 vegetarian meals each week.  Eat more home-cooked food and less restaurant, buffet, and fast food.  When eating at a restaurant, ask that your food be prepared with less salt or no salt, if possible. What foods are recommended? The items listed may not be a complete  list. Talk with your dietitian about what dietary choices are best for you. Grains Whole-grain or whole-wheat bread. Whole-grain or whole-wheat pasta. Brown rice. Modena Morrow. Bulgur. Whole-grain and low-sodium cereals. Pita bread. Low-fat, low-sodium crackers. Whole-wheat flour tortillas. Vegetables Fresh or frozen vegetables (raw, steamed, roasted, or grilled). Low-sodium or reduced-sodium tomato and vegetable juice. Low-sodium or reduced-sodium tomato sauce and tomato paste. Low-sodium or reduced-sodium canned vegetables. Fruits All fresh, dried, or frozen fruit. Canned fruit in natural juice (without added sugar). Meat and other protein foods Skinless chicken or [place]. Ground chicken or [place]. Pork with fat trimmed off. Fish and seafood. Egg whites. Dried beans, peas, or lentils. Unsalted nuts, nut butters, and seeds. Unsalted canned beans. Lean cuts of beef with fat trimmed off. Low-sodium, lean deli meat. Dairy Low-fat (1%) or fat-free (skim) milk. Fat-free, low-fat, or reduced-fat cheeses. Nonfat, low-sodium ricotta or cottage cheese. Low-fat or nonfat yogurt. Low-fat, low-sodium cheese. Fats and oils Soft margarine without trans fats. Vegetable oil. Low-fat, reduced-fat, or light mayonnaise and salad dressings (reduced-sodium). Canola, safflower, olive, soybean, and sunflower oils. Avocado. Seasoning and other foods Herbs. Spices. Seasoning mixes without salt. Unsalted popcorn and pretzels. Fat-free sweets. What foods are not recommended? The items listed may not be a complete list. Talk with your dietitian  about what dietary choices are best for you. Grains Baked goods made with fat, such as croissants, muffins, or some breads. Dry pasta or rice meal packs. Vegetables Creamed or fried vegetables. Vegetables in a cheese sauce. Regular canned vegetables (not low-sodium or reduced-sodium). Regular canned tomato sauce and paste (not low-sodium or reduced-sodium). Regular tomato and  vegetable juice (not low-sodium or reduced-sodium). Angie Fava. Olives. Fruits Canned fruit in a light or heavy syrup. Fried fruit. Fruit in cream or butter sauce. Meat and other protein foods Fatty cuts of meat. Ribs. Fried meat. Berniece Salines. Sausage. Bologna and other processed lunch meats. Salami. Fatback. Hotdogs. Bratwurst. Salted nuts and seeds. Canned beans with added salt. Canned or smoked fish. Whole eggs or egg yolks. Chicken or [place] with skin. Dairy Whole or 2% milk, cream, and half-and-half. Whole or full-fat cream cheese. Whole-fat or sweetened yogurt. Full-fat cheese. Nondairy creamers. Whipped toppings. Processed cheese and cheese spreads. Fats and oils Butter. Stick margarine. Lard. Shortening. Ghee. Bacon fat. Tropical oils, such as coconut, palm kernel, or palm oil. Seasoning and other foods Salted popcorn and pretzels. Onion salt, garlic salt, seasoned salt, table salt, and sea salt. Worcestershire sauce. Tartar sauce. Barbecue sauce. Teriyaki sauce. Soy sauce, including reduced-sodium. Steak sauce. Canned and packaged gravies. Fish sauce. Oyster sauce. Cocktail sauce. Horseradish that you find on the shelf. Ketchup. Mustard. Meat flavorings and tenderizers. Bouillon cubes. Hot sauce and Tabasco sauce. Premade or packaged marinades. Premade or packaged taco seasonings. Relishes. Regular salad dressings. Where to find more information:  National Heart, Lung, and [place]: https://wilson-eaton.com/  American Heart Association: www.heart.org Summary  The DASH eating plan is a healthy eating plan that has been shown to reduce high blood pressure (hypertension). It may also reduce your risk for type 2 diabetes, heart disease, and stroke.  With the DASH eating plan, you should limit salt (sodium) intake to 2,300 mg a day. If you have hypertension, you may need to reduce your sodium intake to 1,500 mg a day.  When on the DASH eating plan, aim to eat more fresh fruits and vegetables, whole  grains, lean proteins, low-fat dairy, and heart-healthy fats.  Work with your health care provider or diet and nutrition specialist (dietitian) to adjust your eating plan to your individual calorie needs. This information is not intended to replace advice given to you by your health care provider. Make sure you discuss any questions you have with your health care provider. Document Released: 04/09/2011 Document Revised: 04/13/2016 Document Reviewed: 04/13/2016 Elsevier Interactive Patient Education  2018 [employer].   Patient Education  Fat and Cholesterol Restricted Diet High levels of fat and cholesterol in your blood may lead to various health problems, such as diseases of the heart, blood vessels, gallbladder, liver, and pancreas. Fats are concentrated sources of energy that come in various forms. Certain types of fat, including saturated fat, may be harmful in excess. Cholesterol is a substance needed by your body in small amounts. Your body makes all the cholesterol it needs. Excess cholesterol comes from the food you eat. When you have high levels of cholesterol and saturated fat in your blood, health problems can develop because the excess fat and cholesterol will gather along the walls of your blood vessels, causing them to narrow. Choosing the right foods will help you control your intake of fat and cholesterol. This will help keep the levels of these substances in your blood within normal limits and reduce your risk of disease. What is my plan? Your health  care provider recommends that you:  Limit your fat intake to ______% or less of your total calories per day.  Limit the amount of cholesterol in your diet to less than _________mg per day.  Eat 20-30 grams of fiber each day. What types of fat should I choose?  Choose healthy fats more often. Choose monounsaturated and polyunsaturated fats, such as olive and canola oil, flaxseeds, walnuts, almonds, and seeds.  Eat more omega-3  fats. Good choices include salmon, mackerel, sardines, tuna, flaxseed oil, and ground flaxseeds. Aim to eat fish at least two times a week.  Limit saturated fats. Saturated fats are primarily found in animal products, such as meats, butter, and cream. Plant sources of saturated fats include palm oil, palm kernel oil, and coconut oil.  Avoid foods with partially hydrogenated oils in them. These contain trans fats. Examples of foods that contain trans fats are stick margarine, some tub margarines, cookies, crackers, and other baked goods. What general guidelines do I need to follow? These guidelines for healthy eating will help you control your intake of fat and cholesterol:  Check food labels carefully to identify foods with trans fats or high amounts of saturated fat.  Fill one half of your plate with vegetables and green salads.  Fill one fourth of your plate with whole grains. Look for the word "whole" as the first word in the ingredient list.  Fill one fourth of your plate with lean protein foods.  Limit fruit to two servings a day. Choose fruit instead of juice.  Eat more foods that contain fiber, such as apples, broccoli, carrots, beans, peas, and barley.  Eat more home-cooked food and less restaurant, buffet, and fast food.  Limit or avoid alcohol.  Limit foods high in starch and sugar.  Limit fried foods.  Cook foods using methods other than frying. Baking, boiling, grilling, and broiling are all great options.  Lose weight if you are overweight. Losing just 5-10% of your initial body weight can help your overall health and prevent diseases such as diabetes and heart disease. What foods can I eat? Grains   Whole grains, such as whole wheat or whole grain breads, crackers, cereals, and pasta. Unsweetened oatmeal, bulgur, barley, quinoa, or brown rice. Corn or whole wheat flour tortillas. Vegetables   Fresh or frozen vegetables (raw, steamed, roasted, or grilled). Green  salads. Fruits   All fresh, canned (in natural juice), or frozen fruits. Meats and other protein foods   Ground beef (85% or leaner), grass-fed beef, or beef trimmed of fat. Skinless chicken or [place]. Ground chicken or [place]. Pork trimmed of fat. All fish and seafood. Eggs. Dried beans, peas, or lentils. Unsalted nuts or seeds. Unsalted canned or dry beans. Dairy   Low-fat dairy products, such as skim or 1% milk, 2% or reduced-fat cheeses, low-fat ricotta or cottage cheese, or plain low-fat yo Fats and oils   Tub margarines without trans fats. Light or reduced-fat mayonnaise and salad dressings. Avocado. Olive, canola, sesame, or safflower oils. Natural peanut or almond butter (choose ones without added sugar and oil). The items listed above may not be a complete list of recommended foods or beverages. Contact your dietitian for more options.  Foods to avoid Grains   White bread. White pasta. White rice. Cornbread. Bagels, pastries, and croissants. Crackers that contain trans fat. Vegetables   White potatoes. Corn. Creamed or fried vegetables. Vegetables in a cheese sauce. Fruits   Dried fruits. Canned fruit in light or heavy syrup. Fruit  juice. Meats and other protein foods   Fatty cuts of meat. Ribs, chicken wings, bacon, sausage, bologna, salami, chitterlings, fatback, hot dogs, bratwurst, and packaged luncheon meats. Liver and organ meats. Dairy   Whole or 2% milk, cream, half-and-half, and cream cheese. Whole milk cheeses. Whole-fat or sweetened yogurt. Full-fat cheeses. Nondairy creamers and whipped toppings. Processed cheese, cheese spreads, or cheese curds. Beverages   Alcohol. Sweetened drinks (such as sodas, lemonade, and fruit drinks or punches). Fats and oils   Butter, stick margarine, lard, shortening, ghee, or bacon fat. Coconut, palm kernel, or palm oils. Sweets and desserts   Corn syrup, sugars, honey, and molasses. Candy. Jam and jelly. Syrup. Sweetened  cereals. Cookies, pies, cakes, donuts, muffins, and ice cream. The items listed above may not be a complete list of foods and beverages to avoid. Contact your dietitian for more information.  This information is not intended to replace advice given to you by your health care provider. Make sure you discuss any questions you have with your health care provider. Document Released: 04/20/2005 Document Revised: 05/11/2014 Document Reviewed: 07/19/2013 Elsevier Interactive Patient Education  2017 [employer].      Progress Notes - in this encounter   [name], MD - 11/23/2016 2:00 PM EDT Formatting of this note may be different from the original. Established Patient Visit   Chief Complaint: Chief Complaint  Patient presents with  . Follow-up  3 MONTHS  Date of Service: 11/23/2016 Date of Birth: Sep 29, 1945 PCP: DAVID Ashley Akin, MD  History of Present Illness: Martin Hardy is a 71 y.o.male patient who returns for  1. Nonischemic dilated cardiomyopathy 2. Chronic systolic congestive heart failure 3. 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 Normal coronary anatomy by cardiac catheterization 01/16/2009 5. Essential hypertension 6. Hyperlipidemia 7. Sleep apnea  Patient returns for follow-up, reports doing well from a cardiovascular perspective. He denies chest pain or shortness of breath. He has occasional palpitations with which is chronic and unchanged. He has intermittent peripheral edema left greater than right. The patient is active but does not do any structured exercise. ICD interrogation 10/13/2016 revealed elective replacement indication. 2D echocardiogram on 03/17/2016 revealed mildly reduced left ventricular function with LVEF 45% with moderate mitral regurgitation, mild TR and AR. 2D echocardiogram 03/09/2014 revealed LVEF 35%.  The patient has essential hypertension, blood pressure well controlled, currently on lisinopril, hydrochlorothiazide, and carvedilol, which  are well tolerated without apparent side effects.The patient does not follow a low-sodium, no added salt diet.  Past Medical and Surgical History  Past Medical History Past Medical History:  Diagnosis Date  . Abdominal aortic aneurysm 30 to 34 mm in diameter (CMS-HCC)  USS 02/2014 - needs repeat 2018  . Carcinoma of rectum (CMS-HCC) 2012  . Cardiomyopathy (CMS-HCC)  . Carotid artery stenosis  <50% bil on USS 2015  . CHF (congestive heart failure) (CMS-HCC)  . Cyst of left kidney  . Depression, unspecified  . Diverticulitis  . Duodenal ulcer  BLEEDING  . EtOH dependence (CMS-HCC)  . Fatty liver  USS 02/2014  . Gallstone  USS 02/2014  . Gout  . Hyperlipidemia, unspecified  . Hypertension  . Sciatica neuralgia, left  . Sleep apnea  . Syncope 2014  NML CAROIDS, TELE AND ECHO STABLE  . 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; Knee arthroscopy (04/29/2009); RIGHT LONG TRIGGER FINGER  RELEASE (01/17/2003); DEVIATED SEPTUM (1992); HERNIA REPAIR SURGERY (1975); Limited arthroscopic debridement with debridement of labrum, arthroscopic subacromial decompression, and mini-open rotator cuff repair, left shoulder (Left, 06/13/2015); Limited arthroscopic debridment and mini-open rotator cuff repair, left shoulder (Left, 01/09/2016); Release right ring trigger finger (Right, 05/13/2016); Colonoscopy (06/23/2010); Colonoscopy (10/19/2011); egd (08/13/2008, 05/14/2008, 03/03/2005); flexible sigmoidoscopy (07/01/2010); and Colonoscopy (07/22/2016).   Medications and Allergies  Current Medications  Current Outpatient Prescriptions  Medication Sig Dispense Refill  . amLODIPine (NORVASC) 5 MG tablet Take 1 tablet (5 mg total) by mouth once daily. 30 tablet 5  . aspirin 81 MG EC tablet Take  81 mg by mouth once daily.  . carvedilol (COREG) 25 MG tablet TAKE ONE TABLET BY MOUTH TWICE DAILY 60 tablet 5  . colchicine (COLCRYS-BRAND) 0.6 mg tablet Take 2 tablets (1.2mg ) by mouth at first sign of gout flare followed by 1 tablet (0.6mg ) after 1 hour. (Max 1.8mg  within 1 hour) 10 tablet 3  . fluticasone (FLONASE) 50 mcg/actuation nasal spray Place 2 sprays into both nostrils once daily. Reported on 08/15/2015  . hydroCHLOROthiazide (HYDRODIURIL) 25 MG tablet TAKE ONE TABLET EVERY DAY 90 tablet 2  . linaclotide (LINZESS) 145 mcg capsule Take 1 capsule (145 mcg total) by mouth once daily. 30 capsule 5  . lisinopril (PRINIVIL,ZESTRIL) 40 MG tablet TAKE ONE TABLET EVERY DAY 90 tablet 3  . LYRICA 150 mg capsule TAKE 1 CAPSULE TWICE DAILY 60 capsule 5  . omeprazole (PRILOSEC) 20 MG DR capsule TAKE 1 CAPSULE BY MOUTH TWICE DAILY 60 capsule 5  . potassium chloride (KLOR-CON) 20 mEq packet Take 1 packet (20 mEq total) by mouth once daily. 10 packet 0  . tamsulosin (FLOMAX) 0.4 mg capsule TAKE 1 CAPSULE EVERY DAY TAKE 30 MIN AFTER SAME MEAL EACH DAY 90 capsule 0  . traZODone (DESYREL) 50 MG tablet ONE-HALF TABLET AT BEDTIME FOR 1 WEEK THEN TAKE ONE TABLET AT BEDTIMEFOR SLEEP 30 tablet 11  . venlafaxine (EFFEXOR-XR) 75 MG XR capsule TAKE 1 CAPSULE EVERY DAY 90 capsule 2   No current facility-administered medications for this visit.   Allergies: Patient has no known allergies.  Social and Family History  Social History reports that he has never smoked. He has quit using smokeless tobacco. His smokeless tobacco use included Snuff. He reports that he does not drink alcohol or use drugs.  Family History Family History  Problem Relation Age of Onset  . COPD Mother  . High blood pressure (Hypertension) Mother  . High blood pressure (Hypertension) Father  . Cancer Father  . Colon polyps Father   Review of Systems   Review of Systems: The patient reports an episode chest pain as described above,  with exertional shortness of breath, without orthopnea, paroxysmal nocturnal dyspnea, pedal edema, reports frequent palpitations, heart racing, without presyncope, syncope. Review of 12 Systems is negative except as described above.  Physical Examination   Vitals: BP 136/62  Pulse 70  Ht 177.8 cm (5\' 10" )  Wt 98.9 kg (218 lb)  SpO2 96%  BMI 31.28 kg/m  Ht:177.8 cm (5\' 10" ) Wt:98.9 kg (218 lb) ENI:DPOE surface area is 2.21 meters squared. Body mass index is 31.28 kg/m.  General: Alert and oriented. Well-appearing. No acute distress. HEENT: Pupils equally reactive to light and accomodation  Neck: Supple, carotid pulses 2+ Lungs: Normal effort of breathing; clear to auscultation bilaterally; no wheezes, rales, rhonchi Heart: Regular rate and rhythm. 1/6 systolic murmur Abdomen: soft nontender, nondistended, with normal bowel sounds Extremities: no cyanosis,  clubbing, or edema Peripheral Pulses: 2+ radial and dorsalis pedis pulses bilaterally Skin: Warm, dry, no diaphoresis  Assessment   71 y.o. male with  1. Essential hypertension  2. Systolic congestive heart failure, unspecified HF chronicity (CMS-HCC)  3. Bilateral carotid artery stenosis  4. Dilated cardiomyopathy (CMS-HCC)  5. Obstructive sleep apnea syndrome  6. Hyperlipidemia, unspecified hyperlipidemia type  7. History of cardiac cath  8. Biventricular ICD (implantable cardioverter-defibrillator) in place   71 year old gentleman with nonischemic dilated cardiomyopathy, chronic systolic congestive heart failure, status post ICD, overall clinically stable with chronic exertional dyspnea.. Patient has essential hypertension, blood pressure well controlled on current BP medications. ICD interrogation reveals elective replacement indication.  Plan   1. Continue current medications 2. Counseled patient about low-sodium diet 3. DASH diet printed instructions given to the patient 4. Counseled patient about low-cholesterol  diet 5. Low-fat and cholesterol diet printed instructions given to the patient 6. ICD generator change out 7. Return to clinic after ICD generator change  No orders of the defined types were placed in this encounter.  Return in about 2 weeks (around 12/07/2016), or after ICD change out.  [name], MD PhD [facility]     Plan of Treatment - as of this encounter   Upcoming Encounters Upcoming Encounters  Date Type Specialty Care Team Description  02/09/2017 Procedure visit Cardiology [name], MD  [place]  [facility]  [place], [place] [address]  [phone]  [phone] (Fax)    02/22/2017 Ancillary Orders Lab Angelena Form, MD  [place], [place] [address]  [phone]  [phone] (Fax)    03/01/2017 Office Visit  Angelena Form, MD  [place], [place] [address]  [phone]  [phone] (Fax)     Visit Diagnoses    Diagnosis  Essential hypertension - Primary  Systolic congestive heart failure, unspecified HF chronicity (CMS-HCC)  Bilateral carotid artery stenosis  Occlusion and stenosis of carotid artery without mention of cerebral infarction   Dilated cardiomyopathy (CMS-HCC)  Other primary cardiomyopathies   Obstructive sleep apnea syndrome  Obstructive sleep apnea (adult) (pediatric)   Hyperlipidemia, unspecified hyperlipidemia type  History of cardiac cath  Other postprocedural status   Biventricular ICD (implantable cardioverter-defibrillator) in place   Images Document Information   Primary Care Provider Angelena Form MD (Mar. 17, 2015 - Present) [phone] (Work) [phone] (Fax) [place], [place] [address]  Document Coverage Dates Jul. 23, 2018  [place], [place] [address]   Encounter Providers [name] MD  (Attending) [phone] (Work) [phone] (Fax) [place] [facility] [place], [place] [address]   Encounter Date Jul. 23, 2018   Show All Sections

## 2016-12-03 NOTE — Anesthesia Preprocedure Evaluation (Addendum)
Anesthesia Evaluation  Patient identified by MRN, date of birth, ID band Patient awake    Reviewed: Allergy & Precautions, NPO status , Patient's Chart, lab work & pertinent test results, reviewed documented beta blocker date and time   Airway Mallampati: III  TM Distance: >3 FB     Dental  (+) Chipped   Pulmonary sleep apnea and Continuous Positive Airway Pressure Ventilation ,           Cardiovascular hypertension, Pt. on medications and Pt. on home beta blockers +CHF  + dysrhythmias + pacemaker + Cardiac Defibrillator      Neuro/Psych PSYCHIATRIC DISORDERS Depression  Neuromuscular disease    GI/Hepatic PUD, GERD  Controlled,  Endo/Other    Renal/GU      Musculoskeletal  (+) Arthritis ,   Abdominal   Peds  Hematology   Anesthesia Other Findings Obese. Hx of ETOH. Low plts. Hypertensive. Hx of multiple PVCs. Had IRbbb in the past, EKG does not show that now. K=3.3.  Reproductive/Obstetrics                            Anesthesia Physical Anesthesia Plan  ASA: III  Anesthesia Plan: MAC   Post-op Pain Management:    Induction:   PONV Risk Score and Plan:   Airway Management Planned:   Additional Equipment:   Intra-op Plan:   Post-operative Plan:   Informed Consent: I have reviewed the patients History and Physical, chart, labs and discussed the procedure including the risks, benefits and alternatives for the proposed anesthesia with the patient or authorized representative who has indicated his/her understanding and acceptance.     Plan Discussed with: CRNA  Anesthesia Plan Comments:         Anesthesia Quick Evaluation

## 2016-12-03 NOTE — Interval H&P Note (Signed)
History and Physical Interval Note:  12/03/2016 1:34 PM  Martin Hardy.  has presented today for surgery, with the diagnosis of CARDIOMYOTHAPY  The various methods of treatment have been discussed with the patient and family. After consideration of risks, benefits and other options for treatment, the patient has consented to  Procedure(s): BIV ICD GENERATOR CHANGE (N/A) as a surgical intervention .  The patient's history has been reviewed, patient examined, no change in status, stable for surgery.  I have reviewed the patient's chart and labs.  Questions were answered to the patient's satisfaction.     Martin Hardy [employer]

## 2016-12-03 NOTE — Progress Notes (Signed)
Applied sling to right arm

## 2016-12-04 ENCOUNTER — Encounter: Payer: Self-pay | Admitting: Cardiology

## 2016-12-04 NOTE — Anesthesia Postprocedure Evaluation (Signed)
Anesthesia Post Note  Patient: Martin Hardy.  Procedure(s) Performed: Procedure(s) (LRB): BIV ICD GENERATOR CHANGE (N/A)  Patient location during evaluation: PACU Anesthesia Type: MAC Level of consciousness: awake and alert Pain management: pain level controlled Vital Signs Assessment: post-procedure vital signs reviewed and stable Respiratory status: spontaneous breathing, nonlabored ventilation, respiratory function stable and patient connected to nasal cannula oxygen Cardiovascular status: stable and blood pressure returned to baseline Anesthetic complications: no     Last Vitals:  Vitals:   12/03/16 1545 12/03/16 1600  BP: 136/66 (!) 143/81  Pulse: 66   Resp: 14 14  Temp:      Last Pain:  Vitals:   12/03/16 1222  TempSrc: Tympanic                 Jolon Degante S

## 2016-12-08 ENCOUNTER — Encounter: Payer: Self-pay | Admitting: Cardiology

## 2017-01-05 ENCOUNTER — Emergency Department: Payer: Medicare Other

## 2017-01-05 ENCOUNTER — Encounter: Payer: Self-pay | Admitting: *Deleted

## 2017-01-05 ENCOUNTER — Emergency Department
Admission: EM | Admit: 2017-01-05 | Discharge: 2017-01-05 | Disposition: A | Discharge status: Home / Self Care | Payer: Medicare Other | Attending: Emergency Medicine | Admitting: Emergency Medicine

## 2017-01-05 DIAGNOSIS — N451 Epididymitis: Secondary | ICD-10-CM

## 2017-01-05 DIAGNOSIS — I509 Heart failure, unspecified: Secondary | ICD-10-CM | POA: Diagnosis not present

## 2017-01-05 DIAGNOSIS — I110 Hypertensive heart disease with heart failure: Secondary | ICD-10-CM | POA: Diagnosis not present

## 2017-01-05 DIAGNOSIS — Z79899 Other long term (current) drug therapy: Secondary | ICD-10-CM | POA: Insufficient documentation

## 2017-01-05 DIAGNOSIS — Z87891 Personal history of nicotine dependence: Secondary | ICD-10-CM | POA: Diagnosis not present

## 2017-01-05 DIAGNOSIS — N5082 Scrotal pain: Secondary | ICD-10-CM

## 2017-01-05 DIAGNOSIS — Z95810 Presence of automatic (implantable) cardiac defibrillator: Secondary | ICD-10-CM | POA: Diagnosis not present

## 2017-01-05 DIAGNOSIS — Z7982 Long term (current) use of aspirin: Secondary | ICD-10-CM | POA: Insufficient documentation

## 2017-01-05 LAB — URINALYSIS, COMPLETE (UACMP) WITH MICROSCOPIC
Bacteria, UA: NONE SEEN
Bilirubin Urine: NEGATIVE
Glucose, UA: NEGATIVE mg/dL
Hgb urine dipstick: NEGATIVE
KETONES UR: NEGATIVE mg/dL
Nitrite: NEGATIVE
PH: 6 (ref 5.0–8.0)
Protein, ur: NEGATIVE mg/dL
SPECIFIC GRAVITY, URINE: 1.013 (ref 1.005–1.030)

## 2017-01-05 MED ORDER — LEVOFLOXACIN 500 MG PO TABS: 500.0000 mg | ORAL_TABLET | Freq: Every day | ORAL | 0 refills | Status: AC

## 2017-01-05 MED ORDER — NAPROXEN 500 MG PO TABS: 500.0000 mg | ORAL_TABLET | Freq: Once | ORAL | Status: DC

## 2017-01-05 MED ORDER — TRAMADOL HCL 50 MG PO TABS: 50.0000 mg | ORAL_TABLET | Freq: Four times a day (QID) | ORAL | 0 refills | Status: DC | PRN

## 2017-01-05 NOTE — ED Provider Notes (Signed)
[facility] Emergency Department Provider Note   ____________________________________________    I have reviewed the triage vital signs and the nursing notes.   HISTORY  Chief Complaint Testicle Pain     HPI Martin Hardy. is a 71 y.o. male who presents with complaints of right testicular pain and swelling. Patient reports he noticed it this morning and was normal last night. He describes swelling to the right side of his scrotum and pain with palpation. He states he has never had this before. He denies fevers. He says not at risk of STDs and denies dysuria or frequency. No discharge. No fevers or chills. No history of diabetes. No abdominal pain nausea or vomiting. He has not taken anything for this   Past Medical History:  Diagnosis Date  . AICD (automatic cardioverter/defibrillator) present   . Alcohol abuse   . Bladder tumor   . BPH (benign prostatic hyperplasia)   . Cancer [facility]) 2012   [place] resected colonoscopy  . Cardiomyopathy ([place])   . CHF (congestive heart failure) (HCC)    CHRONIC  . Depression   . Diverticulosis   . Dysrhythmia   . Enlarged heart   . Enlarged heart   . Epididymal mass   . GERD (gastroesophageal reflux disease)   . History of bleeding peptic ulcer   . History of blood transfusion   . Hx MRSA infection   . Hypertension   . Obesity   . Overweight   . Presence of permanent cardiac pacemaker   . PUD (peptic ulcer disease)   . Scrotal cyst   . Skin cancer    basal cell, shoulders chest and arms   . Skin cancer 2013   melanoma on chest  . Sleep apnea    cpap  . Spermatocele   . Urinary retention     Patient Active Problem List   Diagnosis Date Noted  . Congestive heart failure ([place]) 04/18/2015  . Alcohol dependence ([place]) 04/18/2015  . Thrombocytopenia ([place]) 04/18/2015  . Lumbar canal stenosis 11/13/2014  . Gross hematuria 10/22/2014  . Status post recent transurethral resection of prostate  10/22/2014  . CCF (congestive cardiac failure) ([place]) 09/27/2014  . Alcohol addiction ([place]) 09/27/2014  . BP (high blood pressure) 09/27/2014  . Neuralgia neuritis, sciatic nerve 09/27/2014  . Change in blood platelet count 09/27/2014  . Apnea, sleep 02/28/2014  . Cardiac defibrillator in place 02/28/2014  . Cardiomyopathy ([place]) 02/28/2014  . H/O cardiac catheterization 02/28/2014  . HLD (hyperlipidemia) 02/28/2014  . History of cardiac catheterization 02/28/2014  . Benign fibroma of prostate 02/20/2014  . DDD (degenerative disc disease), lumbar 01/24/2014  . Neuritis or radiculitis due to rupture of lumbar intervertebral disc 01/22/2014    Past Surgical History:  Procedure Laterality Date  . CATARACT EXTRACTION, BILATERAL    . COLONOSCOPY WITH PROPOFOL N/A 07/22/2016   Procedure: COLONOSCOPY WITH PROPOFOL;  Surgeon: Manya Silvas, MD;  Location: [facility] ENDOSCOPY;  Service: Endoscopy;  Laterality: N/A;  . EP IMPLANTABLE DEVICE    . EYE SURGERY Bilateral 2014   cataract  . FOOT NEUROMA SURGERY    . gastric ulcer    . GREEN LIGHT LASER TURP (TRANSURETHRAL RESECTION OF PROSTATE N/A 09/24/2014   Procedure: GREEN LIGHT LASER TURP (TRANSURETHRAL RESECTION OF PROSTATE;  Surgeon: Collier Flowers, MD;  Location: [facility] ORS;  Service: Urology;  Laterality: N/A;  . HERNIA REPAIR    . IMPLANTABLE CARDIOVERTER DEFIBRILLATOR (ICD) GENERATOR CHANGE N/A 12/03/2016  Procedure: BIV ICD GENERATOR CHANGE;  Surgeon: [name], MD;  Location: [facility] ORS;  Service: Cardiovascular;  Laterality: N/A;  ICD  generator discarded per protocall  . SHOULDER ARTHROSCOPY WITH OPEN ROTATOR CUFF REPAIR Left 06/13/2015   Procedure: SHOULDER ARTHROSCOPIC debridement and decompression and repair of rotator cuff tear;  Surgeon: Corky Mull, MD;  Location: [facility] ORS;  Service: Orthopedics;  Laterality: Left;  . SHOULDER ARTHROSCOPY WITH OPEN ROTATOR CUFF REPAIR Left 01/09/2016   Procedure: SHOULDER ARTHROSCOPY,  DEBRIDEMENT AND WITH OPEN ROTATOR CUFF REPAIR;  Surgeon: Corky Mull, MD;  Location: [facility] ORS;  Service: Orthopedics;  Laterality: Left;  . TRIGGER FINGER RELEASE Right 05/13/2016   Procedure: RELEASE TRIGGER FINGER right ring finger;  Surgeon: Corky Mull, MD;  Location: [place];  Service: Orthopedics;  Laterality: Right;  sleep apnea  . VAGOTOMY      Prior to Admission medications   Medication Sig Start Date End Date Taking? Authorizing Provider  aspirin EC 81 MG tablet Take 81 mg by mouth daily.    Yes [provider]  carvedilol (COREG) 25 MG tablet Take 25 mg by mouth 2 (two) times daily.    Yes [provider]  hydrochlorothiazide (HYDRODIURIL) 25 MG tablet Take 25 mg by mouth daily.   Yes [provider]  lisinopril (PRINIVIL,ZESTRIL) 40 MG tablet Take 40 mg by mouth daily.    Yes [provider]  loratadine (CLARITIN) 10 MG tablet Take 10 mg by mouth daily as needed for allergies.   Yes [provider]  omeprazole (PRILOSEC) 20 MG capsule Take 20 mg by mouth 2 (two) times daily.    Yes [provider]  oxymetazoline (AFRIN) 0.05 % nasal spray Place 1 spray into both nostrils daily as needed for congestion.   Yes [provider]  pregabalin (LYRICA) 150 MG capsule Take 150 mg by mouth at bedtime.    Yes [provider]  tamsulosin (FLOMAX) 0.4 MG CAPS capsule Take 0.4 mg by mouth 2 (two) times daily.   Yes [provider]  venlafaxine XR (EFFEXOR-XR) 75 MG 24 hr capsule Take 75 mg by mouth daily. 11/02/16  Yes [provider]  levofloxacin (LEVAQUIN) 500 MG tablet Take 1 tablet (500 mg total) by mouth daily. 01/05/17 01/15/17  Lavonia Drafts, MD  traMADol (ULTRAM) 50 MG tablet Take 1 tablet (50 mg total) by mouth every 6 (six) hours as needed. 01/05/17 01/05/18  Lavonia Drafts, MD     Allergies Patient has no known allergies.  Family History  Problem Relation Age of Onset  . COPD Mother     . Hypertension Mother        Father  . Prostate cancer Neg Hx     Social History Social History  Substance Use Topics  . Smoking status: Never Smoker  . Smokeless tobacco: Former Systems developer    Types: Snuff  . Alcohol use 23.4 oz/week    39 Cans of beer per week     Comment:      Review of Systems  Constitutional: No fever/chills Eyes: No visual changes.  ENT: No sore throat. Cardiovascular: Denies chest pain. Respiratory: Denies shortness of breath. Gastrointestinal: No abdominal pain.  No nausea, no vomiting.   Genitourinary: Negative for dysuria.Scrotal swelling as above Musculoskeletal: Back pain after washing car today, now better Skin: Mild redness to the scrotum, no rash Neurological: Negative for headaches   ____________________________________________   PHYSICAL EXAM:  VITAL SIGNS: ED Triage Vitals  Enc  Vitals Group     BP 01/05/17 0816 (!) 144/73     Pulse Rate 01/05/17 0816 67     Resp 01/05/17 0816 16     Temp 01/05/17 0816 98.4 F (36.9 C)     Temp Source 01/05/17 0816 Oral     SpO2 01/05/17 0816 97 %     Weight 01/05/17 0813 98.9 kg (218 lb)     Height 01/05/17 0813 1.778 m (5\' 10" )     Head Circumference --      Peak Flow --      Pain Score 01/05/17 0813 8     Pain Loc --      Pain Edu? --      Excl. in [place]? --     Constitutional: Alert and oriented. No acute distress. Pleasant and interactive Eyes: Conjunctivae are normal.   Nose: No congestion/rhinnorhea. Mouth/Throat: Mucous membranes are moist.    Cardiovascular: Normal rate, regular rhythm. Grossly normal heart sounds.  Good peripheral circulation. Respiratory: Normal respiratory effort.  No retractions. Lungs CTAB. Gastrointestinal: Soft and nontender. No distention.  No CVA tenderness. Genitourinary: Right testicle tender along the lateral and superior aspect, scrotum is mildly swollen and erythematous on the right primarily. Uncircumcised. No penile discharge Musculoskeletal:   Warm  and well perfused Neurologic:  Normal speech and language. No gross focal neurologic deficits are appreciated.  Skin:  Skin is warm, dry and intact. No rash noted. Psychiatric: Mood and affect are normal. Speech and behavior are normal.  ____________________________________________   LABS (all labs ordered are listed, but only abnormal results are displayed)  Labs Reviewed  URINALYSIS, COMPLETE (UACMP) WITH MICROSCOPIC - Abnormal; Notable for the following:       Result Value   Color, Urine YELLOW (*)    APPearance CLEAR (*)    Leukocytes, UA SMALL (*)    Squamous Epithelial / LPF 0-5 (*)    All other components within normal limits  URINE CULTURE   ____________________________________________  EKG None ____________________________________________  RADIOLOGY  Scrotal ultrasound consistent with epididymitis ____________________________________________   PROCEDURES  Procedure(s) performed: No    Critical Care performed: No ____________________________________________   INITIAL IMPRESSION / ASSESSMENT AND PLAN / ED COURSE  Pertinent labs & imaging results that were available during my care of the patient were reviewed by me and considered in my medical decision making (see chart for details).  Patient presents with right scrotal swelling, suspect epididymitis. Patient states he's never had this before but in his medical history does have a history of scrotal cysts and epididymal mass  Ultrasound consistent with epididymitis, we'll treat with antibiotics, culture sent outpatient follow-up as needed. Patient agrees with plan.    ____________________________________________   FINAL CLINICAL IMPRESSION(S) / ED DIAGNOSES  Final diagnoses:  Scrotal pain  Epididymitis      NEW MEDICATIONS STARTED DURING THIS VISIT:  New Prescriptions   LEVOFLOXACIN (LEVAQUIN) 500 MG TABLET    Take 1 tablet (500 mg total) by mouth daily.   TRAMADOL (ULTRAM) 50 MG TABLET     Take 1 tablet (50 mg total) by mouth every 6 (six) hours as needed.     Note:  This document was prepared using Dragon voice recognition software and may include unintentional dictation errors.    Lavonia Drafts, MD 01/05/17 1030

## 2017-01-05 NOTE — ED Triage Notes (Signed)
Pt brought here from [facility] with right testicle pain and swelling for 1 day

## 2017-01-06 LAB — URINE CULTURE

## 2017-01-25 NOTE — Progress Notes (Signed)
01/26/2017 4:00 PM   Martin Hardy 1945/09/17 [id number]  Referring provider: [name], MD [place], [place] [address]  Chief Complaint  Patient presents with  . Follow-up    Epididymitis    HPI: Patient is a 71 year old Caucasian male with a history of alcoholism who is referred to [place] by [name], PA-C for epididymitis.  Patient was initially seen in the emergency room on 01/05/2017 for this condition. At that time, he complained of right testicular pain and swelling. Patient reported that he noticed it this morning and was normal the night before.  He describes swelling to the right side of his scrotum and pain with palpation. He states he has never had this before. He denies fevers. He says not at risk of STDs and denies dysuria or frequency. No discharge. No fevers or chills. No history of diabetes. No abdominal pain nausea or vomiting. He has not taken anything for this  Scrotal ultrasound performed on 01/05/2017 noted no evidence for testicular mass or torsion.  Suspect right-sided epididymitis.  Small right hydrocele  Bilateral microlithiasis. He was given Levaquin and Tramadol.    He presented to his PCP 6 days later with only a 15% improvement of symptoms.  He was then prescribed doxycycline and referred to [place].  Today, he is still having swelling in his right scrotum.  The pain has diminished.  He has not had fevers, chills, nausea or vomiting.  His UA today is negative.  BPH WITH LUTS  (prostate and/or bladder) His IPSS score today is 10, which is moderate lower urinary tract symptomatology. He is mixed with his quality life due to his urinary symptoms.  His PVR is 37 mL.     He is still CIC x 2 a week.  He has had these symptoms for several years.  He denies any dysuria, hematuria or suprapubic pain.   He currently taking tamsulosin 0.4 mg daily.    His has had green light laser procedure in 2016 with Dr. [name].  He has a history of  urinary retention, especially when intoxicated.  He has had to CIC in the past.     He also denies any recent fevers, chills, nausea or vomiting.        IPSS    Row Name 01/26/17 1500         International Prostate Symptom Score   How often have you had the sensation of not emptying your bladder? Not at All     How often have you had to urinate less than every two hours? Less than 1 in 5 times     How often have you found you stopped and started again several times when you urinated? Not at All     How often have you found it difficult to postpone urination? About half the time     How often have you had a weak urinary stream? Not at All     How often have you had to strain to start urination? More than half the time     How many times did you typically get up at night to urinate? 2 Times     Total IPSS Score 10       Quality of Life due to urinary symptoms   If you were to spend the rest of your life with your urinary condition just the way it is now how would you feel about that? Mixed  Score:  1-7 Mild 8-19 Moderate 20-35 Severe    Reviewed referral notes.- see above  PMH: Past Medical History:  Diagnosis Date  . AICD (automatic cardioverter/defibrillator) present   . Alcohol abuse   . Bladder tumor   . BPH (benign prostatic hyperplasia)   . Cancer [facility]) 2012   [place] resected colonoscopy  . Cardiomyopathy ([place])   . CHF (congestive heart failure) ([facility])    CHRONIC  . Depression   . Diverticulosis   . Dysrhythmia   . Enlarged heart   . Enlarged heart   . Epididymal mass   . GERD (gastroesophageal reflux disease)   . History of bleeding peptic ulcer   . History of blood transfusion   . Hx MRSA infection   . Hypertension   . Obesity   . Overweight   . Presence of permanent cardiac pacemaker   . PUD (peptic ulcer disease)   . Scrotal cyst   . Skin cancer    basal cell, shoulders chest and arms   . Skin cancer 2013   melanoma on chest  . Sleep  apnea    cpap  . Spermatocele   . Urinary retention     Surgical History: Past Surgical History:  Procedure Laterality Date  . CATARACT EXTRACTION, BILATERAL    . COLONOSCOPY WITH PROPOFOL N/A 07/22/2016   Procedure: COLONOSCOPY WITH PROPOFOL;  Surgeon: Manya Silvas, MD;  Location: [facility] ENDOSCOPY;  Service: Endoscopy;  Laterality: N/A;  . EP IMPLANTABLE DEVICE    . EYE SURGERY Bilateral 2014   cataract  . FOOT NEUROMA SURGERY    . gastric ulcer    . GREEN LIGHT LASER TURP (TRANSURETHRAL RESECTION OF PROSTATE N/A 09/24/2014   Procedure: GREEN LIGHT LASER TURP (TRANSURETHRAL RESECTION OF PROSTATE;  Surgeon: Collier Flowers, MD;  Location: [facility] ORS;  Service: Urology;  Laterality: N/A;  . HERNIA REPAIR    . IMPLANTABLE CARDIOVERTER DEFIBRILLATOR (ICD) GENERATOR CHANGE N/A 12/03/2016   Procedure: BIV ICD GENERATOR CHANGE;  Surgeon: [name], MD;  Location: [facility] ORS;  Service: Cardiovascular;  Laterality: N/A;  ICD  generator discarded per protocall  . SHOULDER ARTHROSCOPY WITH OPEN ROTATOR CUFF REPAIR Left 06/13/2015   Procedure: SHOULDER ARTHROSCOPIC debridement and decompression and repair of rotator cuff tear;  Surgeon: Corky Mull, MD;  Location: [facility] ORS;  Service: Orthopedics;  Laterality: Left;  . SHOULDER ARTHROSCOPY WITH OPEN ROTATOR CUFF REPAIR Left 01/09/2016   Procedure: SHOULDER ARTHROSCOPY, DEBRIDEMENT AND WITH OPEN ROTATOR CUFF REPAIR;  Surgeon: Corky Mull, MD;  Location: [facility] ORS;  Service: Orthopedics;  Laterality: Left;  . TRIGGER FINGER RELEASE Right 05/13/2016   Procedure: RELEASE TRIGGER FINGER right ring finger;  Surgeon: Corky Mull, MD;  Location: [facility];  Service: Orthopedics;  Laterality: Right;  sleep apnea  . VAGOTOMY      Home Medications:  Allergies as of 01/26/2017   No Known Allergies     Medication List       Accurate as of 01/26/17 11:59 PM. Always use your most recent med list.          aspirin EC 81 MG tablet Take 81 mg  by mouth daily.   carvedilol 25 MG tablet Commonly known as:  COREG Take 25 mg by mouth 2 (two) times daily.   hydrochlorothiazide 25 MG tablet Commonly known as:  HYDRODIURIL Take 25 mg by mouth daily.   lisinopril 40 MG tablet Commonly known as:  PRINIVIL,ZESTRIL Take 40 mg by mouth daily.  loratadine 10 MG tablet Commonly known as:  CLARITIN Take 10 mg by mouth daily as needed for allergies.   omeprazole 20 MG capsule Commonly known as:  PRILOSEC Take 20 mg by mouth 2 (two) times daily.   oxymetazoline 0.05 % nasal spray Commonly known as:  AFRIN Place 1 spray into both nostrils daily as needed for congestion.   potassium chloride SA 20 MEQ tablet Commonly known as:  K-DUR,KLOR-CON Take 20 mEq by mouth 2 (two) times daily.   pregabalin 150 MG capsule Commonly known as:  LYRICA Take 150 mg by mouth at bedtime.   tamsulosin 0.4 MG Caps capsule Commonly known as:  FLOMAX Take 0.4 mg by mouth 2 (two) times daily.   traMADol 50 MG tablet Commonly known as:  ULTRAM Take 1 tablet (50 mg total) by mouth every 6 (six) hours as needed.   venlafaxine XR 75 MG 24 hr capsule Commonly known as:  EFFEXOR-XR Take 75 mg by mouth daily.       Allergies: No Known Allergies  Family History: Family History  Problem Relation Age of Onset  . COPD Mother   . Hypertension Mother        Father  . Prostate cancer Neg Hx     Social History:  reports that he has never smoked. He has quit using smokeless tobacco. His smokeless tobacco use included Snuff. He reports that he drinks about 23.4 oz of alcohol per week . He reports that he does not use drugs.  ROS: UROLOGY Frequent Urination?: No Hard to postpone urination?: No Burning/pain with urination?: No Get up at night to urinate?: No Leakage of urine?: No Urine stream starts and stops?: No Trouble starting stream?: No Do you have to strain to urinate?: No Blood in urine?: No Urinary tract infection?: No Sexually  transmitted disease?: No Injury to kidneys or bladder?: No Painful intercourse?: No Weak stream?: No Erection problems?: No Penile pain?: No  Gastrointestinal Nausea?: No Vomiting?: No Indigestion/heartburn?: No Diarrhea?: No Constipation?: No  Constitutional Fever: No Night sweats?: No Weight loss?: No Fatigue?: No  Skin Skin rash/lesions?: No Itching?: No  Eyes Blurred vision?: No Double vision?: No  Ears/Nose/Throat Sore throat?: No Sinus problems?: Yes  Hematologic/Lymphatic Swollen glands?: No Easy bruising?: Yes  Cardiovascular Leg swelling?: No Chest pain?: No  Respiratory Cough?: No Shortness of breath?: No  Endocrine Excessive thirst?: No  Musculoskeletal Back pain?: Yes Joint pain?: No  Neurological Headaches?: No Dizziness?: No  Psychologic Depression?: No Anxiety?: No  Physical Exam: BP (!) 165/78 (BP Location: Right Arm, Patient Position: Sitting, Cuff Size: Normal)   Pulse 66   Ht 5\' 10"  (1.778 m)   Wt 219 lb (99.3 kg)   BMI 31.42 kg/m   Constitutional: Well nourished. Alert and oriented, No acute distress. HEENT: [place] AT, moist mucus membranes. Trachea midline, no masses. Cardiovascular: No clubbing, cyanosis, or edema. Respiratory: Normal respiratory effort, no increased work of breathing. GI: Abdomen is soft, non tender, non distended, no abdominal masses. Liver and spleen not palpable.  No hernias appreciated.  Stool sample for occult testing is not indicated.   GU: No CVA tenderness.  No bladder fullness or masses.  Patient with uncircumcised phallus.  Foreskin easily retracted.  Urethral meatus is patent.  No penile discharge. No penile lesions or rashes. Scrotum without lesions, cysts, rashes and/or edema.  Testicles are located scrotally bilaterally. No masses are appreciated in the testicles. Left epididymis is normal.  Right epididymis is indurated.   Rectal:  Deferred.   Skin: No rashes, bruises or suspicious  lesions. Lymph: No cervical or inguinal adenopathy. Neurologic: Grossly intact, no focal deficits, moving all 4 extremities. Psychiatric: Normal mood and affect.  Laboratory Data: Lab Results  Component Value Date   WBC 7.4 11/30/2016   HGB 17.0 12/03/2016   HCT 50.0 12/03/2016   MCV 86.7 11/30/2016   PLT 133 (L) 11/30/2016    Lab Results  Component Value Date   CREATININE 0.85 11/30/2016    Urinalysis Negative.  See EPIC.    I have reviewed the labs.   Pertinent Imaging: CLINICAL DATA:  Right scrotal pain and swelling.  EXAM: ULTRASOUND OF SCROTUM  TECHNIQUE: Complete ultrasound examination of the testicles, epididymis, and other scrotal structures was performed.  COMPARISON:  None.  FINDINGS: Right testicle  Measurements: 4.1 x 3.1 x 3.2 cm. Microlithiasis is again noted. No mass noted.  Left testicle  Measurements: 4.7 x 3.5 x 3.6 cm. Microlithiasis is again noted. No mass.  Right epididymis:  Appears hypervascular and increased in volume.  Left epididymis: 2 cysts are noted. The largest measures 2 x 0.5 cm. The smaller cyst measures 0.3 x 0.2 cm.  Hydrocele:  Small right hydrocele.  Varicocele:  None visualized.  IMPRESSION: 1. No evidence for testicular mass or torsion. 2. Suspect right-sided epididymitis. 3. Small right hydrocele 4. Bilateral microlithiasis. Current literature suggests that testicular microlithiasis is not a significant independent risk factor for development of testicular carcinoma, and that follow up imaging is not warranted in the absence of other risk factors. Monthly testicular self-examination and annual physical exams are considered appropriate surveillance. If patient has other risk factors for testicular carcinoma, then referral to Urology should be considered. (Reference: DeCastro, et al.: A 5-Year Follow up Study of Asymptomatic Men with Testicular Microlithiasis. J Urol  2008; [id number].)   Electronically Signed   By: Kerby Moors M.D.   On: 01/05/2017 09:54  I have independently reviewed the films.    Assessment & Plan:    1. Epididymitis  - patient has a history of heavy alcohol consumption and may be more susceptible to infections  - Given 1 gram of Rocephin in the office  - Restart Levaquin for 10 days  - RTC in 2 weeks for symptom recheck and exam  2. Hydrocele  - Probably reactive  - Will continue to monitor  3. BPH with LUTS  - IPSS score is 10/3  - Continue conservative management, avoiding bladder irritants and timed voiding's  - most bothersome symptoms is/are straining to urinate  - Continue tamsulosin 0.4 mg daily  - RTC in 12 months for IPS'S and exam      Return in about 2 weeks (around 02/09/2017) for Symptom recheck and exam.  These notes generated with voice recognition software. I apologize for typographical errors.  Zara Council, [facility] [address] [place], [place] [address] [phone]

## 2017-01-26 ENCOUNTER — Encounter: Payer: Self-pay | Admitting: Urology

## 2017-01-26 ENCOUNTER — Ambulatory Visit: Payer: Medicare Other | Admitting: Urology

## 2017-01-26 VITALS — BP 165/78 | HR 66 | Ht 70.0 in | Wt 219.0 lb

## 2017-01-26 DIAGNOSIS — N432 Other hydrocele: Secondary | ICD-10-CM | POA: Diagnosis not present

## 2017-01-26 DIAGNOSIS — N451 Epididymitis: Secondary | ICD-10-CM

## 2017-01-26 DIAGNOSIS — N138 Other obstructive and reflux uropathy: Secondary | ICD-10-CM | POA: Diagnosis not present

## 2017-01-26 DIAGNOSIS — N401 Enlarged prostate with lower urinary tract symptoms: Secondary | ICD-10-CM

## 2017-01-26 LAB — URINALYSIS, COMPLETE
Bilirubin, UA: NEGATIVE
Glucose, UA: NEGATIVE
Ketones, UA: NEGATIVE
Leukocytes, UA: NEGATIVE
NITRITE UA: NEGATIVE
PH UA: 7.5 (ref 5.0–7.5)
Protein, UA: NEGATIVE
RBC UA: NEGATIVE
Specific Gravity, UA: 1.015 (ref 1.005–1.030)
UUROB: 2 mg/dL — AB (ref 0.2–1.0)

## 2017-01-26 LAB — MICROSCOPIC EXAMINATION
BACTERIA UA: NONE SEEN
RBC, UA: NONE SEEN /hpf (ref 0–?)

## 2017-01-26 LAB — BLADDER SCAN AMB NON-IMAGING: SCAN RESULT: 37

## 2017-01-26 MED ORDER — CEFTRIAXONE SODIUM 1 G IJ SOLR
1.0000 g | Freq: Once | INTRAMUSCULAR | Status: AC
  Administered 2017-01-26: 1 g via INTRAMUSCULAR

## 2017-01-26 NOTE — Progress Notes (Signed)
IM Injection  Patient is present today for an IM Injection  Drug: Rocephin Dose:1 GM Location:left upper outer buttocks Lot: [id number] M Exp:03/04/2018 Patient tolerated well, no complications were noted  Preformed by: Fonnie Jarvis, CMA

## 2017-01-27 ENCOUNTER — Telehealth: Payer: Self-pay | Admitting: Urology

## 2017-01-27 NOTE — Telephone Encounter (Signed)
Pt does want the antibiotic you were going to call in yesterday.  He said he waited his 9 minutes and then left.  He would like for you to send in rx.  Please advise.

## 2017-01-27 NOTE — Telephone Encounter (Signed)
What pharmacy ?

## 2017-01-28 ENCOUNTER — Other Ambulatory Visit: Payer: Self-pay | Admitting: Urology

## 2017-01-28 MED ORDER — LEVOFLOXACIN 500 MG PO TABS: 500.0000 mg | ORAL_TABLET | Freq: Every day | ORAL | 0 refills | Status: DC

## 2017-01-28 NOTE — Telephone Encounter (Signed)
Pt uses [facility]. Pharmacy has been put in the computer.

## 2017-01-28 NOTE — Progress Notes (Signed)
Please let the patient know that his antibiotic has been called in to the [facility].

## 2017-02-01 ENCOUNTER — Telehealth: Payer: Self-pay | Admitting: Urology

## 2017-02-01 NOTE — Telephone Encounter (Signed)
Patient needs a follow-up appointment in 1-2 weeks to recheck his epididymitis.

## 2017-02-01 NOTE — Telephone Encounter (Signed)
Spoke with pt in reference to needing a f/u of epididmyitis. Pt voiced understanding. Appt made.

## 2017-02-02 ENCOUNTER — Telehealth: Payer: Self-pay

## 2017-02-02 NOTE — Telephone Encounter (Signed)
Pt called after hours and left a message that he was in urgent need of catheters. Returned pt call and spoke with wife. Reinforced with wife that pt did not mention the need of catheters when nurse spoke with pt earlier in the day. Wife stated that he did not realize he was out at that time. Wife stated that she would have pt call back after he gets off work at noon. Reinforced with wife that catheters can be given to pt until shipment arrives. Wife voiced understanding of whole conversation.

## 2017-02-02 NOTE — Telephone Encounter (Signed)
Pt called stating he was out of catheter and needed more. Pt stated that Herbert Moors, NP started him cathing PRN. Pt stated that he used to get his catheters through [facility] and then one day [facility] called him and made him aware they will be supplying him with catheters now but he has not received any.  Sent Joe, Chief Financial Officer rep, an email requesting more information about how pt is getting catheters. Joe stated that pt has been getting his catheters via [facility] and a shipment was sent out on 12/28/16. Joe stated that [facility] has a list of patients who need medical supplies and are calling them to get their business. Joe stated that [facility] has contacted pt and has made pt aware of everything and pt should be good to go. Spoke with pt personally who stated that he was terribly confused but now is ok and has everything taken care of. Reinforced with pt to call back if needed. Pt voiced understanding.

## 2017-02-04 ENCOUNTER — Telehealth: Payer: Self-pay | Admitting: Urology

## 2017-02-04 NOTE — Telephone Encounter (Signed)
Pt wants you to give him a call concerning catheters.  [phone]

## 2017-02-05 NOTE — Telephone Encounter (Signed)
Spoke with pt in reference to catheters. Pt stated that he has not heard from anyone in reference to getting his next shipment. Made pt aware would send an email to Joe, [facility] rep, and when I found out answered would give a call back. Pt voiced understanding of whole conversation. Pt stated that he does have enough catheters to get him through the weekend.

## 2017-02-06 NOTE — Progress Notes (Signed)
02/08/2017 2:14 PM   Martin Hardy Jan 05, 1946 [id number]  Referring provider: [name], MD [place], [place] [address]  Chief Complaint  Patient presents with  . Follow-up    Epididymitis    HPI: 71 yo WM with epididymitis who returns for a 2 week follow up.  Background history Patient is a 71 year old Caucasian male with a history of alcoholism who is referred to [place] by [name], PA-C for epididymitis.  Patient was initially seen in the emergency room on 01/05/2017 for this condition. At that time, he complained of right testicular pain and swelling. Patient reported that he noticed it this morning and was normal the night before.  He describes swelling to the right side of his scrotum and pain with palpation. He states he has never had this before. He denies fevers. He says not at risk of STDs and denies dysuria or frequency. No discharge. No fevers or chills. No history of diabetes. No abdominal pain nausea or vomiting. He has not taken anything for this.  Scrotal ultrasound performed on 01/05/2017 noted no evidence for testicular mass or torsion.  Suspect right-sided epididymitis.  Small right hydrocele  Bilateral microlithiasis. He was given Levaquin and Tramadol.   He presented to his PCP 6 days later with only a 15% improvement of symptoms.  He was then prescribed doxycycline and referred to [place].    At his visit on 01/26/2017, he was still having swelling in his right scrotum.  The pain has diminished.  He has not had fevers, chills, nausea or vomiting.  His UA today was negative.  He was given a gram of Rocephin and restarted on Levaquin 500 mg for 10 days.  Today, he states that he is still having the swelling in the scrotum.  The pain has abated. He has not had fevers, chills, nausea or vomiting. He has not had any penile discharge. His only urinary complaint at this time is nocturia.   BPH WITH LUTS  (prostate and/or bladder) His IPSS score  is 10, which is moderate lower urinary tract symptomatology. He is mixed with his quality life due to his urinary symptoms.  His PVR is 37 mL.   He is still CIC x 2 a week.  He has had these symptoms for several years.  He denies any dysuria, hematuria or suprapubic pain.  He currently taking tamsulosin 0.4 mg daily.   His has had green light laser procedure in 2016 with Dr. [name].  He has a history of urinary retention, especially when intoxicated.  He has had to CIC in the past.   He also denies any recent fevers, chills, nausea or vomiting.       IPSS    Row Name 01/26/17 1500         International Prostate Symptom Score   How often have you had the sensation of not emptying your bladder? Not at All     How often have you had to urinate less than every two hours? Less than 1 in 5 times     How often have you found you stopped and started again several times when you urinated? Not at All     How often have you found it difficult to postpone urination? About half the time     How often have you had a weak urinary stream? Not at All     How often have you had to strain to start urination? More than half  the time     How many times did you typically get up at night to urinate? 2 Times     Total IPSS Score 10       Quality of Life due to urinary symptoms   If you were to spend the rest of your life with your urinary condition just the way it is now how would you feel about that? Mixed        Score:  1-7 Mild 8-19 Moderate 20-35 Severe  PMH: Past Medical History:  Diagnosis Date  . AICD (automatic cardioverter/defibrillator) present   . Alcohol abuse   . Bladder tumor   . BPH (benign prostatic hyperplasia)   . Cancer [facility]) 2012   Polyp resected colonoscopy  . Cardiomyopathy ([place])   . CHF (congestive heart failure) (HCC)    CHRONIC  . Depression   . Diverticulosis   . Dysrhythmia   . Enlarged heart   . Enlarged heart   . Epididymal mass   . GERD (gastroesophageal reflux  disease)   . History of bleeding peptic ulcer   . History of blood transfusion   . Hx MRSA infection   . Hypertension   . Obesity   . Overweight   . Presence of permanent cardiac pacemaker   . PUD (peptic ulcer disease)   . Scrotal cyst   . Skin cancer    basal cell, shoulders chest and arms   . Skin cancer 2013   melanoma on chest  . Sleep apnea    cpap  . Spermatocele   . Urinary retention     Surgical History: Past Surgical History:  Procedure Laterality Date  . CATARACT EXTRACTION, BILATERAL    . COLONOSCOPY WITH PROPOFOL N/A 07/22/2016   Procedure: COLONOSCOPY WITH PROPOFOL;  Surgeon: Manya Silvas, MD;  Location: [facility] ENDOSCOPY;  Service: Endoscopy;  Laterality: N/A;  . EP IMPLANTABLE DEVICE    . EYE SURGERY Bilateral 2014   cataract  . FOOT NEUROMA SURGERY    . gastric ulcer    . GREEN LIGHT LASER TURP (TRANSURETHRAL RESECTION OF PROSTATE N/A 09/24/2014   Procedure: GREEN LIGHT LASER TURP (TRANSURETHRAL RESECTION OF PROSTATE;  Surgeon: Collier Flowers, MD;  Location: [facility] ORS;  Service: Urology;  Laterality: N/A;  . HERNIA REPAIR    . IMPLANTABLE CARDIOVERTER DEFIBRILLATOR (ICD) GENERATOR CHANGE N/A 12/03/2016   Procedure: BIV ICD GENERATOR CHANGE;  Surgeon: [name], MD;  Location: [facility] ORS;  Service: Cardiovascular;  Laterality: N/A;  ICD  generator discarded per protocall  . SHOULDER ARTHROSCOPY WITH OPEN ROTATOR CUFF REPAIR Left 06/13/2015   Procedure: SHOULDER ARTHROSCOPIC debridement and decompression and repair of rotator cuff tear;  Surgeon: Corky Mull, MD;  Location: [facility] ORS;  Service: Orthopedics;  Laterality: Left;  . SHOULDER ARTHROSCOPY WITH OPEN ROTATOR CUFF REPAIR Left 01/09/2016   Procedure: SHOULDER ARTHROSCOPY, DEBRIDEMENT AND WITH OPEN ROTATOR CUFF REPAIR;  Surgeon: Corky Mull, MD;  Location: [facility] ORS;  Service: Orthopedics;  Laterality: Left;  . TRIGGER FINGER RELEASE Right 05/13/2016   Procedure: RELEASE TRIGGER FINGER right ring finger;   Surgeon: Corky Mull, MD;  Location: [place];  Service: Orthopedics;  Laterality: Right;  sleep apnea  . VAGOTOMY      Home Medications:  Allergies as of 02/08/2017   No Known Allergies     Medication List       Accurate as of 02/08/17 11:59 PM. Always use your most recent med list.  aspirin EC 81 MG tablet Take 81 mg by mouth daily.   carvedilol 25 MG tablet Commonly known as:  COREG Take 25 mg by mouth 2 (two) times daily.   hydrochlorothiazide 25 MG tablet Commonly known as:  HYDRODIURIL Take 25 mg by mouth daily.   levofloxacin 500 MG tablet Commonly known as:  LEVAQUIN Take 1 tablet (500 mg total) by mouth daily.   lisinopril 40 MG tablet Commonly known as:  PRINIVIL,ZESTRIL Take 40 mg by mouth daily.   loratadine 10 MG tablet Commonly known as:  CLARITIN Take 10 mg by mouth daily as needed for allergies.   omeprazole 20 MG capsule Commonly known as:  PRILOSEC Take 20 mg by mouth 2 (two) times daily.   oxymetazoline 0.05 % nasal spray Commonly known as:  AFRIN Place 1 spray into both nostrils daily as needed for congestion.   potassium chloride SA 20 MEQ tablet Commonly known as:  K-DUR,KLOR-CON Take 20 mEq by mouth 2 (two) times daily.   pregabalin 150 MG capsule Commonly known as:  LYRICA Take 150 mg by mouth at bedtime.   tamsulosin 0.4 MG Caps capsule Commonly known as:  FLOMAX Take 0.4 mg by mouth 2 (two) times daily.   traMADol 50 MG tablet Commonly known as:  ULTRAM Take 1 tablet (50 mg total) by mouth every 6 (six) hours as needed.   venlafaxine XR 75 MG 24 hr capsule Commonly known as:  EFFEXOR-XR Take 75 mg by mouth daily.       Allergies: No Known Allergies  Family History: Family History  Problem Relation Age of Onset  . COPD Mother   . Hypertension Mother        Father  . Prostate cancer Neg Hx   . Kidney cancer Neg Hx   . Bladder Cancer Neg Hx     Social History:  reports that he has never  smoked. He has quit using smokeless tobacco. His smokeless tobacco use included Snuff. He reports that he drinks about 23.4 oz of alcohol per week . He reports that he does not use drugs.  ROS: UROLOGY Frequent Urination?: No Hard to postpone urination?: No Burning/pain with urination?: No Get up at night to urinate?: Yes Leakage of urine?: No Urine stream starts and stops?: No Trouble starting stream?: No Do you have to strain to urinate?: No Blood in urine?: No Urinary tract infection?: No Sexually transmitted disease?: No Injury to kidneys or bladder?: No Painful intercourse?: No Weak stream?: No Erection problems?: No Penile pain?: No  Gastrointestinal Nausea?: No Vomiting?: No Indigestion/heartburn?: No Diarrhea?: No Constipation?: No  Constitutional Fever: No Night sweats?: No Weight loss?: No Fatigue?: No  Skin Skin rash/lesions?: No Itching?: No  Eyes Blurred vision?: No Double vision?: No  Ears/Nose/Throat Sore throat?: No Sinus problems?: No  Hematologic/Lymphatic Swollen glands?: No Easy bruising?: No  Cardiovascular Leg swelling?: No Chest pain?: No  Respiratory Cough?: No Shortness of breath?: No  Endocrine Excessive thirst?: No  Musculoskeletal Back pain?: No Joint pain?: No  Neurological Headaches?: No Dizziness?: No  Psychologic Depression?: No Anxiety?: No  Physical Exam: BP (!) 162/90   Pulse 62   Ht 5\' 9"  (1.753 m)   Wt 219 lb 9.6 oz (99.6 kg)   BMI 32.43 kg/m   Constitutional: Well nourished. Alert and oriented, No acute distress. HEENT: [place] AT, moist mucus membranes. Trachea midline, no masses. Cardiovascular: No clubbing, cyanosis, or edema. Respiratory: Normal respiratory effort, no increased work of breathing. GI: Abdomen is  soft, non tender, non distended, no abdominal masses. Liver and spleen not palpable.  No hernias appreciated.  Stool sample for occult testing is not indicated.   GU: No CVA tenderness.   No bladder fullness or masses.  Patient with uncircumcised phallus.  Foreskin easily retracted.  Urethral meatus is patent.  No penile discharge. No penile lesions or rashes. Scrotum without lesions, cysts, rashes and/or edema.  Testicles are located scrotally bilaterally. No masses are appreciated in the testicles. Left epididymis is normal.  Right epididymis is indurated.   Rectal: Deferred.   Skin: No rashes, bruises or suspicious lesions. Lymph: No cervical or inguinal adenopathy. Neurologic: Grossly intact, no focal deficits, moving all 4 extremities. Psychiatric: Normal mood and affect.  Laboratory Data: Lab Results  Component Value Date   WBC 7.4 11/30/2016   HGB 17.0 12/03/2016   HCT 50.0 12/03/2016   MCV 86.7 11/30/2016   PLT 133 (L) 11/30/2016    Lab Results  Component Value Date   CREATININE 0.85 11/30/2016    Results for orders placed or performed in visit on 01/26/17  Microscopic Examination  Result Value Ref Range   WBC, UA 0-5 0 - 5 /hpf   RBC, UA None seen 0 - 2 /hpf   Epithelial Cells (non renal) 0-10 0 - 10 /hpf   Bacteria, UA None seen None seen/Few  Urinalysis, Complete  Result Value Ref Range   Specific Gravity, UA 1.015 1.005 - 1.030   pH, UA 7.5 5.0 - 7.5   Color, UA Yellow Yellow   Appearance Ur Clear Clear   Leukocytes, UA Negative Negative   Protein, UA Negative Negative/Trace   Glucose, UA Negative Negative   Ketones, UA Negative Negative   RBC, UA Negative Negative   Bilirubin, UA Negative Negative   Urobilinogen, Ur 2.0 (H) 0.2 - 1.0 mg/dL   Nitrite, UA Negative Negative   Microscopic Examination See below:   Bladder Scan (Post Void Residual) in office  Result Value Ref Range   Scan Result 37    I have reviewed the labs.   Assessment & Plan:    1. Epididymitis  - most likely resolved  - Repeat scrotal ultrasound as the right epididymis appeared hypervascular with findings suggestive of epididymitis - but was not conclusive  2.  Hydrocele  - Probably reactive  - Will continue to monitor  3. BPH with LUTS  - IPSS score is 10/3  - Continue conservative management, avoiding bladder irritants and timed voiding's  - most bothersome symptoms is/are straining to urinate  - Continue tamsulosin 0.4 mg daily  - RTC in 12 months for I PSS and exam   Return for I will call patient with results.  These notes generated with voice recognition software. I apologize for typographical errors.  Zara Council, [facility] [address] [place], [place] [address] [phone]

## 2017-02-08 ENCOUNTER — Encounter: Payer: Self-pay | Admitting: Urology

## 2017-02-08 ENCOUNTER — Ambulatory Visit: Payer: Medicare Other | Admitting: Urology

## 2017-02-08 VITALS — BP 162/90 | HR 62 | Ht 69.0 in | Wt 219.6 lb

## 2017-02-08 DIAGNOSIS — N432 Other hydrocele: Secondary | ICD-10-CM | POA: Diagnosis not present

## 2017-02-08 DIAGNOSIS — N451 Epididymitis: Secondary | ICD-10-CM

## 2017-02-17 ENCOUNTER — Telehealth: Payer: Self-pay | Admitting: Urology

## 2017-02-17 NOTE — Telephone Encounter (Signed)
They have tried to call him and he has not called them back yet.  Sharyn Lull

## 2017-02-17 NOTE — Telephone Encounter (Signed)
Would you check and see if Mr. Martin Hardy is scrotal ultrasound has been scheduled?

## 2017-05-09 NOTE — Progress Notes (Signed)
05/10/2017 8:59 PM   Martin Hardy 02-01-1946 [id number]  Referring provider: [name], MD [place], [place] [address]  Chief Complaint  Patient presents with  . Groin Swelling    HPI: 72 yo WM with epididymitis who returns for a follow up.  Patient is a 72 year old Caucasian male with a history of alcoholism who was referred to [place] by [name], PA-C for epididymitis.  Patient was initially seen in the emergency room on 01/05/2017 for this condition. At that time, he complained of right testicular pain and swelling. Patient reported that he noticed it the morning and was normal the night before.  He described swelling to the right side of his scrotum and pain with palpation. He stated he had never had this before. He denied fevers. He says he is not at risk of STDs and denied dysuria or frequency. No discharge. No fevers or chills. No history of diabetes. No abdominal pain nausea or vomiting. He had not taken anything for this.  Scrotal ultrasound performed on 01/05/2017 noted no evidence for testicular mass or torsion.  Suspect right-sided epididymitis.  Small right hydrocele  Bilateral microlithiasis. He was given Levaquin and Tramadol.   He presented to his PCP 6 days later with only a 15% improvement of symptoms.  He was then prescribed doxycycline and referred to [place].    At his visit on 01/26/2017, he was still having swelling in his right scrotum.  The pain has diminished.  He has not had fevers, chills, nausea or vomiting.  His UA today was negative.  He was given a gram of Rocephin and restarted on Levaquin 500 mg for 10 days.  At his visit on 02/08/2017, he stated that he is still having the swelling in the scrotum.  The pain has abated. He had not had fevers, chills, nausea or vomiting. He had not had any penile discharge. His only urinary complaint at that time is nocturia.   Today, he is having swelling in his right scrotum.  He has not had  fevers, chills, nausea or vomiting.  He has not dysuria, gross hematuria or suprapubic pain.  His PVR today is 0 mL.  He denies any trauma to the right scrotum.  Nothing has made the scrotal swelling worse or better.    PMH: Past Medical History:  Diagnosis Date  . AICD (automatic cardioverter/defibrillator) present   . Alcohol abuse   . Bladder tumor   . BPH (benign prostatic hyperplasia)   . Cancer [facility]) 2012   Polyp resected colonoscopy  . Cardiomyopathy ([place])   . CHF (congestive heart failure) (HCC)    CHRONIC  . Depression   . Diverticulosis   . Dysrhythmia   . Enlarged heart   . Enlarged heart   . Epididymal mass   . GERD (gastroesophageal reflux disease)   . History of bleeding peptic ulcer   . History of blood transfusion   . Hx MRSA infection   . Hypertension   . Obesity   . Overweight   . Presence of permanent cardiac pacemaker   . PUD (peptic ulcer disease)   . Scrotal cyst   . Skin cancer    basal cell, shoulders chest and arms   . Skin cancer 2013   melanoma on chest  . Sleep apnea    cpap  . Spermatocele   . Urinary retention     Surgical History: Past Surgical History:  Procedure Laterality Date  . CATARACT  EXTRACTION, BILATERAL    . COLONOSCOPY WITH PROPOFOL N/A 07/22/2016   Procedure: COLONOSCOPY WITH PROPOFOL;  Surgeon: Manya Silvas, MD;  Location: [facility] ENDOSCOPY;  Service: Endoscopy;  Laterality: N/A;  . EP IMPLANTABLE DEVICE    . EYE SURGERY Bilateral 2014   cataract  . FOOT NEUROMA SURGERY    . gastric ulcer    . GREEN LIGHT LASER TURP (TRANSURETHRAL RESECTION OF PROSTATE N/A 09/24/2014   Procedure: GREEN LIGHT LASER TURP (TRANSURETHRAL RESECTION OF PROSTATE;  Surgeon: Collier Flowers, MD;  Location: [facility] ORS;  Service: Urology;  Laterality: N/A;  . HERNIA REPAIR    . IMPLANTABLE CARDIOVERTER DEFIBRILLATOR (ICD) GENERATOR CHANGE N/A 12/03/2016   Procedure: BIV ICD GENERATOR CHANGE;  Surgeon: [name], MD;  Location: [facility] ORS;   Service: Cardiovascular;  Laterality: N/A;  ICD  generator discarded per protocall  . SHOULDER ARTHROSCOPY WITH OPEN ROTATOR CUFF REPAIR Left 06/13/2015   Procedure: SHOULDER ARTHROSCOPIC debridement and decompression and repair of rotator cuff tear;  Surgeon: Corky Mull, MD;  Location: [facility] ORS;  Service: Orthopedics;  Laterality: Left;  . SHOULDER ARTHROSCOPY WITH OPEN ROTATOR CUFF REPAIR Left 01/09/2016   Procedure: SHOULDER ARTHROSCOPY, DEBRIDEMENT AND WITH OPEN ROTATOR CUFF REPAIR;  Surgeon: Corky Mull, MD;  Location: [facility] ORS;  Service: Orthopedics;  Laterality: Left;  . TRIGGER FINGER RELEASE Right 05/13/2016   Procedure: RELEASE TRIGGER FINGER right ring finger;  Surgeon: Corky Mull, MD;  Location: [place];  Service: Orthopedics;  Laterality: Right;  sleep apnea  . VAGOTOMY      Home Medications:  Allergies as of 05/10/2017   No Known Allergies     Medication List        Accurate as of 05/10/17 11:59 PM. Always use your most recent med list.          aspirin EC 81 MG tablet Take 81 mg by mouth daily.   carvedilol 25 MG tablet Commonly known as:  COREG Take 25 mg by mouth 2 (two) times daily.   hydrochlorothiazide 25 MG tablet Commonly known as:  HYDRODIURIL Take 25 mg by mouth daily.   levofloxacin 500 MG tablet Commonly known as:  LEVAQUIN Take 1 tablet (500 mg total) by mouth daily.   lisinopril 40 MG tablet Commonly known as:  PRINIVIL,ZESTRIL Take 40 mg by mouth daily.   loratadine 10 MG tablet Commonly known as:  CLARITIN Take 10 mg by mouth daily as needed for allergies.   omeprazole 20 MG capsule Commonly known as:  PRILOSEC Take 20 mg by mouth 2 (two) times daily.   oxymetazoline 0.05 % nasal spray Commonly known as:  AFRIN Place 1 spray into both nostrils daily as needed for congestion.   potassium chloride SA 20 MEQ tablet Commonly known as:  K-DUR,KLOR-CON Take 20 mEq by mouth 2 (two) times daily.   pregabalin 150 MG  capsule Commonly known as:  LYRICA Take 150 mg by mouth at bedtime.   tamsulosin 0.4 MG Caps capsule Commonly known as:  FLOMAX Take 0.4 mg by mouth 2 (two) times daily.   venlafaxine XR 75 MG 24 hr capsule Commonly known as:  EFFEXOR-XR Take 75 mg by mouth daily.       Allergies: No Known Allergies  Family History: Family History  Problem Relation Age of Onset  . COPD Mother   . Hypertension Mother        Father  . Prostate cancer Neg Hx   . Kidney cancer Neg Hx   .  Bladder Cancer Neg Hx     Social History:  reports that  has never smoked. He has quit using smokeless tobacco. His smokeless tobacco use included snuff. He reports that he drinks about 23.4 oz of alcohol per week. He reports that he does not use drugs.  ROS: UROLOGY Frequent Urination?: No Hard to postpone urination?: No Burning/pain with urination?: No Get up at night to urinate?: No Leakage of urine?: No Urine stream starts and stops?: No Trouble starting stream?: No Do you have to strain to urinate?: No Blood in urine?: No Urinary tract infection?: No Sexually transmitted disease?: No Injury to kidneys or bladder?: No Painful intercourse?: No Weak stream?: No Erection problems?: No Penile pain?: No  Gastrointestinal Nausea?: No Vomiting?: No Indigestion/heartburn?: No Diarrhea?: No Constipation?: No  Constitutional Fever: No Night sweats?: No Weight loss?: No Fatigue?: No  Skin Skin rash/lesions?: No Itching?: No  Eyes Blurred vision?: No Double vision?: No  Ears/Nose/Throat Sore throat?: No Sinus problems?: No  Hematologic/Lymphatic Swollen glands?: No Easy bruising?: No  Cardiovascular Leg swelling?: No Chest pain?: No  Respiratory Cough?: No Shortness of breath?: No  Endocrine Excessive thirst?: No  Musculoskeletal Back pain?: Yes Joint pain?: No  Neurological Headaches?: No Dizziness?: No  Psychologic Depression?: No Anxiety?: No  Physical  Exam: BP (!) 183/88 (BP Location: Right Arm, Patient Position: Sitting, Cuff Size: Large)   Pulse 74   Ht 5\' 9"  (1.753 m)   Wt 219 lb 14.4 oz (99.7 kg)   BMI 32.47 kg/m   Constitutional: Well nourished. Alert and oriented, No acute distress. HEENT: [place] AT, moist mucus membranes. Trachea midline, no masses. Cardiovascular: No clubbing, cyanosis, or edema. Respiratory: Normal respiratory effort, no increased work of breathing. GI: Abdomen is soft, non tender, non distended, no abdominal masses. Liver and spleen not palpable.  No hernias appreciated.  Stool sample for occult testing is not indicated.   GU: No CVA tenderness.  No bladder fullness or masses.  Patient with uncircumcised phallus.  Foreskin easily retracted Urethral meatus is patent.  No penile discharge. No penile lesions or rashes. Scrotum without lesions, cysts, rashes and/or edema.  Testicles are located scrotally bilaterally. No masses are appreciated in the testicles. Left and right epididymis are normal. Rectal: Deferred.   Skin: No rashes, bruises or suspicious lesions. Lymph: No cervical or inguinal adenopathy. Neurologic: Grossly intact, no focal deficits, moving all 4 extremities. Psychiatric: Normal mood and affect.   Laboratory Data: Lab Results  Component Value Date   WBC 7.4 11/30/2016   HGB 17.0 12/03/2016   HCT 50.0 12/03/2016   MCV 86.7 11/30/2016   PLT 133 (L) 11/30/2016    Lab Results  Component Value Date   CREATININE 0.85 11/30/2016    Results for orders placed or performed in visit on 05/10/17  Bladder Scan (Post Void Residual) in office  Result Value Ref Range   Scan Result 0    I have reviewed the labs.   Assessment & Plan:    1. Epididymitis  - most likely resolved - patient is insistent that his right testicle is larger   - will repeat scrotal ultrasound as the right epididymis appeared hypervascular with findings suggestive of epididymitis - but was not conclusive  - I will call  the patient with results  2. Hydrocele  - may explain the patient's perceived enlargement of right testicle - ultrasound pending  3. BPH with LU TS  - PVR is minimal  Return for I will call patient  with results.  These notes generated with voice recognition software. I apologize for typographical errors.  Zara Council, [facility] [address], [place], [place] [address] [phone]

## 2017-05-10 ENCOUNTER — Ambulatory Visit: Payer: Medicare Other | Admitting: Urology

## 2017-05-10 ENCOUNTER — Encounter: Payer: Self-pay | Admitting: Urology

## 2017-05-10 VITALS — BP 183/88 | HR 74 | Ht 69.0 in | Wt 219.9 lb

## 2017-05-10 DIAGNOSIS — N451 Epididymitis: Secondary | ICD-10-CM | POA: Diagnosis not present

## 2017-05-10 DIAGNOSIS — N432 Other hydrocele: Secondary | ICD-10-CM

## 2017-05-10 DIAGNOSIS — N401 Enlarged prostate with lower urinary tract symptoms: Secondary | ICD-10-CM | POA: Diagnosis not present

## 2017-05-10 DIAGNOSIS — N138 Other obstructive and reflux uropathy: Secondary | ICD-10-CM | POA: Diagnosis not present

## 2017-05-10 LAB — BLADDER SCAN AMB NON-IMAGING: Scan Result: 0

## 2017-05-18 ENCOUNTER — Ambulatory Visit: Payer: Medicare Other

## 2017-06-07 ENCOUNTER — Other Ambulatory Visit: Payer: Self-pay | Admitting: Family Medicine

## 2017-06-07 ENCOUNTER — Ambulatory Visit
Admission: RE | Admit: 2017-06-07 | Discharge: 2017-06-07 | Disposition: A | Discharge status: Home / Self Care | Payer: Medicare Other | Source: Ambulatory Visit | Attending: Family Medicine | Admitting: Family Medicine

## 2017-06-07 DIAGNOSIS — R932 Abnormal findings on diagnostic imaging of liver and biliary tract: Secondary | ICD-10-CM | POA: Diagnosis not present

## 2017-06-07 DIAGNOSIS — R1011 Right upper quadrant pain: Secondary | ICD-10-CM

## 2017-06-08 ENCOUNTER — Observation Stay
Admission: RE | Admit: 2017-06-08 | Discharge: 2017-06-09 | Disposition: A | Discharge status: Home / Self Care | Payer: Medicare Other | Source: Ambulatory Visit | Attending: Surgery | Admitting: Surgery

## 2017-06-08 ENCOUNTER — Ambulatory Visit: Payer: Medicare Other

## 2017-06-08 ENCOUNTER — Other Ambulatory Visit: Payer: Self-pay

## 2017-06-08 ENCOUNTER — Encounter
Admission: RE | Disposition: A | Discharge status: Home / Self Care | Payer: Self-pay | Source: Ambulatory Visit | Attending: Surgery

## 2017-06-08 ENCOUNTER — Ambulatory Visit: Payer: Medicare Other | Admitting: Anesthesiology

## 2017-06-08 ENCOUNTER — Encounter: Payer: Self-pay | Admitting: *Deleted

## 2017-06-08 DIAGNOSIS — K219 Gastro-esophageal reflux disease without esophagitis: Secondary | ICD-10-CM | POA: Insufficient documentation

## 2017-06-08 DIAGNOSIS — Z825 Family history of asthma and other chronic lower respiratory diseases: Secondary | ICD-10-CM | POA: Diagnosis not present

## 2017-06-08 DIAGNOSIS — I110 Hypertensive heart disease with heart failure: Secondary | ICD-10-CM | POA: Diagnosis not present

## 2017-06-08 DIAGNOSIS — M1990 Unspecified osteoarthritis, unspecified site: Secondary | ICD-10-CM | POA: Insufficient documentation

## 2017-06-08 DIAGNOSIS — K8000 Calculus of gallbladder with acute cholecystitis without obstruction: Secondary | ICD-10-CM | POA: Diagnosis present

## 2017-06-08 DIAGNOSIS — F329 Major depressive disorder, single episode, unspecified: Secondary | ICD-10-CM | POA: Diagnosis not present

## 2017-06-08 DIAGNOSIS — I509 Heart failure, unspecified: Secondary | ICD-10-CM | POA: Insufficient documentation

## 2017-06-08 DIAGNOSIS — M109 Gout, unspecified: Secondary | ICD-10-CM | POA: Insufficient documentation

## 2017-06-08 DIAGNOSIS — Z8711 Personal history of peptic ulcer disease: Secondary | ICD-10-CM | POA: Diagnosis not present

## 2017-06-08 DIAGNOSIS — I429 Cardiomyopathy, unspecified: Secondary | ICD-10-CM | POA: Insufficient documentation

## 2017-06-08 DIAGNOSIS — Z6832 Body mass index (BMI) 32.0-32.9, adult: Secondary | ICD-10-CM | POA: Insufficient documentation

## 2017-06-08 DIAGNOSIS — M5432 Sciatica, left side: Secondary | ICD-10-CM | POA: Insufficient documentation

## 2017-06-08 DIAGNOSIS — D696 Thrombocytopenia, unspecified: Secondary | ICD-10-CM | POA: Diagnosis not present

## 2017-06-08 DIAGNOSIS — Z87891 Personal history of nicotine dependence: Secondary | ICD-10-CM | POA: Insufficient documentation

## 2017-06-08 DIAGNOSIS — Z79899 Other long term (current) drug therapy: Secondary | ICD-10-CM | POA: Diagnosis not present

## 2017-06-08 DIAGNOSIS — I6523 Occlusion and stenosis of bilateral carotid arteries: Secondary | ICD-10-CM | POA: Diagnosis not present

## 2017-06-08 DIAGNOSIS — K279 Peptic ulcer, site unspecified, unspecified as acute or chronic, without hemorrhage or perforation: Secondary | ICD-10-CM | POA: Insufficient documentation

## 2017-06-08 DIAGNOSIS — Z7982 Long term (current) use of aspirin: Secondary | ICD-10-CM | POA: Diagnosis not present

## 2017-06-08 DIAGNOSIS — E785 Hyperlipidemia, unspecified: Secondary | ICD-10-CM | POA: Insufficient documentation

## 2017-06-08 DIAGNOSIS — Z419 Encounter for procedure for purposes other than remedying health state, unspecified: Secondary | ICD-10-CM

## 2017-06-08 DIAGNOSIS — R002 Palpitations: Secondary | ICD-10-CM | POA: Diagnosis not present

## 2017-06-08 DIAGNOSIS — G4730 Sleep apnea, unspecified: Secondary | ICD-10-CM | POA: Insufficient documentation

## 2017-06-08 DIAGNOSIS — Z85048 Personal history of other malignant neoplasm of rectum, rectosigmoid junction, and anus: Secondary | ICD-10-CM | POA: Insufficient documentation

## 2017-06-08 DIAGNOSIS — K8010 Calculus of gallbladder with chronic cholecystitis without obstruction: Principal | ICD-10-CM | POA: Insufficient documentation

## 2017-06-08 DIAGNOSIS — Z8249 Family history of ischemic heart disease and other diseases of the circulatory system: Secondary | ICD-10-CM | POA: Insufficient documentation

## 2017-06-08 DIAGNOSIS — E669 Obesity, unspecified: Secondary | ICD-10-CM | POA: Insufficient documentation

## 2017-06-08 DIAGNOSIS — Z8371 Family history of colonic polyps: Secondary | ICD-10-CM | POA: Diagnosis not present

## 2017-06-08 DIAGNOSIS — K760 Fatty (change of) liver, not elsewhere classified: Secondary | ICD-10-CM | POA: Diagnosis not present

## 2017-06-08 DIAGNOSIS — N319 Neuromuscular dysfunction of bladder, unspecified: Secondary | ICD-10-CM | POA: Insufficient documentation

## 2017-06-08 DIAGNOSIS — I493 Ventricular premature depolarization: Secondary | ICD-10-CM | POA: Insufficient documentation

## 2017-06-08 HISTORY — PX: CHOLECYSTECTOMY: SHX55

## 2017-06-08 LAB — SURGICAL PCR SCREEN
MRSA, PCR: NEGATIVE
Staphylococcus aureus: NEGATIVE

## 2017-06-08 SURGERY — LAPAROSCOPIC CHOLECYSTECTOMY
Anesthesia: General | Wound class: Clean Contaminated

## 2017-06-08 MED ORDER — PROPOFOL 10 MG/ML IV BOLUS
INTRAVENOUS | Status: DC | PRN
  Administered 2017-06-08: 150 mg via INTRAVENOUS

## 2017-06-08 MED ORDER — FENTANYL CITRATE (PF) 100 MCG/2ML IJ SOLN
INTRAMUSCULAR | Status: AC
  Filled 2017-06-08: qty 2

## 2017-06-08 MED ORDER — POTASSIUM CHLORIDE CRYS ER 20 MEQ PO TBCR: 20.0000 meq | EXTENDED_RELEASE_TABLET | ORAL | Status: DC

## 2017-06-08 MED ORDER — SUGAMMADEX SODIUM 200 MG/2ML IV SOLN
INTRAVENOUS | Status: DC | PRN
  Administered 2017-06-08: 200 mg via INTRAVENOUS

## 2017-06-08 MED ORDER — ONDANSETRON HCL 4 MG/2ML IJ SOLN
INTRAMUSCULAR | Status: DC | PRN
  Administered 2017-06-08: 4 mg via INTRAVENOUS

## 2017-06-08 MED ORDER — LIDOCAINE HCL (PF) 2 % IJ SOLN
INTRAMUSCULAR | Status: AC
  Filled 2017-06-08: qty 10

## 2017-06-08 MED ORDER — ONDANSETRON HCL 4 MG/2ML IJ SOLN
INTRAMUSCULAR | Status: AC
  Filled 2017-06-08: qty 2

## 2017-06-08 MED ORDER — SODIUM CHLORIDE 0.9 % IV SOLN
INTRAVENOUS | Status: DC | PRN
  Administered 2017-06-08: 10 mL

## 2017-06-08 MED ORDER — KCL IN DEXTROSE-NACL 20-5-0.2 MEQ/L-%-% IV SOLN
INTRAVENOUS | Status: DC
  Administered 2017-06-08: 17:00:00 via INTRAVENOUS
  Filled 2017-06-08 (×4): qty 1000

## 2017-06-08 MED ORDER — BUPIVACAINE-EPINEPHRINE (PF) 0.5% -1:200000 IJ SOLN
INTRAMUSCULAR | Status: AC
  Filled 2017-06-08: qty 30

## 2017-06-08 MED ORDER — HYDROCODONE-ACETAMINOPHEN 5-325 MG PO TABS
1.0000 | ORAL_TABLET | ORAL | Status: DC | PRN
  Administered 2017-06-08 – 2017-06-09 (×3): 2 via ORAL
  Filled 2017-06-08 (×3): qty 2

## 2017-06-08 MED ORDER — ONDANSETRON HCL 4 MG/2ML IJ SOLN: 4.0000 mg | Freq: Once | INTRAMUSCULAR | Status: DC | PRN

## 2017-06-08 MED ORDER — ROCURONIUM BROMIDE 100 MG/10ML IV SOLN
INTRAVENOUS | Status: DC | PRN
  Administered 2017-06-08 (×3): 10 mg via INTRAVENOUS
  Administered 2017-06-08: 5 mg via INTRAVENOUS
  Administered 2017-06-08: 35 mg via INTRAVENOUS
  Administered 2017-06-08 (×2): 10 mg via INTRAVENOUS
  Administered 2017-06-08: 5 mg via INTRAVENOUS

## 2017-06-08 MED ORDER — ROCURONIUM BROMIDE 50 MG/5ML IV SOLN
INTRAVENOUS | Status: AC
  Filled 2017-06-08: qty 1

## 2017-06-08 MED ORDER — LORATADINE 10 MG PO TABS: 10.0000 mg | ORAL_TABLET | Freq: Every day | ORAL | Status: DC | PRN

## 2017-06-08 MED ORDER — PANTOPRAZOLE SODIUM 40 MG PO TBEC
40.0000 mg | DELAYED_RELEASE_TABLET | Freq: Every day | ORAL | Status: DC
Start: 2017-06-09 — End: 2017-06-09

## 2017-06-08 MED ORDER — BUPIVACAINE-EPINEPHRINE 0.5% -1:200000 IJ SOLN
INTRAMUSCULAR | Status: DC | PRN
  Administered 2017-06-08: 11 mL

## 2017-06-08 MED ORDER — MORPHINE SULFATE (PF) 2 MG/ML IV SOLN: 1.0000 mg | INTRAVENOUS | Status: DC | PRN

## 2017-06-08 MED ORDER — DEXAMETHASONE SODIUM PHOSPHATE 10 MG/ML IJ SOLN
INTRAMUSCULAR | Status: AC
  Filled 2017-06-08: qty 1

## 2017-06-08 MED ORDER — HEPARIN SODIUM (PORCINE) 1000 UNIT/ML IJ SOLN
INTRAMUSCULAR | Status: DC | PRN
  Administered 2017-06-08: 1000 mL via INTRAMUSCULAR

## 2017-06-08 MED ORDER — LACTATED RINGERS IV SOLN
INTRAVENOUS | Status: DC
  Administered 2017-06-08: 09:00:00 via INTRAVENOUS

## 2017-06-08 MED ORDER — OXYMETAZOLINE HCL 0.05 % NA SOLN
1.0000 | Freq: Every day | NASAL | Status: DC | PRN
  Filled 2017-06-08: qty 15

## 2017-06-08 MED ORDER — LIDOCAINE HCL (CARDIAC) 20 MG/ML IV SOLN
INTRAVENOUS | Status: DC | PRN
  Administered 2017-06-08: 100 mg via INTRAVENOUS

## 2017-06-08 MED ORDER — PROPOFOL 10 MG/ML IV BOLUS
INTRAVENOUS | Status: AC
  Filled 2017-06-08: qty 20

## 2017-06-08 MED ORDER — LISINOPRIL 20 MG PO TABS: 40.0000 mg | ORAL_TABLET | Freq: Every day | ORAL | Status: DC

## 2017-06-08 MED ORDER — TRAZODONE HCL 50 MG PO TABS
50.0000 mg | ORAL_TABLET | Freq: Every day | ORAL | Status: DC
  Administered 2017-06-08: 50 mg via ORAL
  Filled 2017-06-08: qty 1

## 2017-06-08 MED ORDER — ONDANSETRON 4 MG PO TBDP: 4.0000 mg | ORAL_TABLET | Freq: Four times a day (QID) | ORAL | Status: DC | PRN

## 2017-06-08 MED ORDER — ORAL CARE MOUTH RINSE
15.0000 mL | Freq: Two times a day (BID) | OROMUCOSAL | Status: DC
  Administered 2017-06-08: 15 mL via OROMUCOSAL

## 2017-06-08 MED ORDER — CHLORDIAZEPOXIDE HCL 25 MG PO CAPS
25.0000 mg | ORAL_CAPSULE | Freq: Three times a day (TID) | ORAL | Status: DC
  Administered 2017-06-08 (×2): 25 mg via ORAL
  Filled 2017-06-08 (×2): qty 1

## 2017-06-08 MED ORDER — EPHEDRINE SULFATE 50 MG/ML IJ SOLN
INTRAMUSCULAR | Status: DC | PRN
  Administered 2017-06-08 (×3): 5 mg via INTRAVENOUS

## 2017-06-08 MED ORDER — FENTANYL CITRATE (PF) 100 MCG/2ML IJ SOLN
INTRAMUSCULAR | Status: AC
  Administered 2017-06-08: 25 ug via INTRAVENOUS
  Filled 2017-06-08: qty 2

## 2017-06-08 MED ORDER — HEPARIN SODIUM (PORCINE) 5000 UNIT/ML IJ SOLN
INTRAMUSCULAR | Status: AC
  Filled 2017-06-08: qty 1

## 2017-06-08 MED ORDER — TAMSULOSIN HCL 0.4 MG PO CAPS: 0.4000 mg | ORAL_CAPSULE | Freq: Every day | ORAL | Status: DC

## 2017-06-08 MED ORDER — VENLAFAXINE HCL ER 75 MG PO CP24: 75.0000 mg | ORAL_CAPSULE | Freq: Every day | ORAL | Status: DC

## 2017-06-08 MED ORDER — SUCCINYLCHOLINE CHLORIDE 20 MG/ML IJ SOLN
INTRAMUSCULAR | Status: DC | PRN
  Administered 2017-06-08: 100 mg via INTRAVENOUS

## 2017-06-08 MED ORDER — SUGAMMADEX SODIUM 200 MG/2ML IV SOLN
INTRAVENOUS | Status: AC
  Filled 2017-06-08: qty 2

## 2017-06-08 MED ORDER — ONDANSETRON HCL 4 MG/2ML IJ SOLN: 4.0000 mg | Freq: Four times a day (QID) | INTRAMUSCULAR | Status: DC | PRN

## 2017-06-08 MED ORDER — HYDROCHLOROTHIAZIDE 25 MG PO TABS: 25.0000 mg | ORAL_TABLET | Freq: Every day | ORAL | Status: DC

## 2017-06-08 MED ORDER — FENTANYL CITRATE (PF) 100 MCG/2ML IJ SOLN
25.0000 ug | INTRAMUSCULAR | Status: DC | PRN
  Administered 2017-06-08 (×5): 25 ug via INTRAVENOUS

## 2017-06-08 MED ORDER — PREGABALIN 75 MG PO CAPS
150.0000 mg | ORAL_CAPSULE | Freq: Every day | ORAL | Status: DC
  Administered 2017-06-08: 150 mg via ORAL
  Filled 2017-06-08: qty 2

## 2017-06-08 MED ORDER — DEXAMETHASONE SODIUM PHOSPHATE 10 MG/ML IJ SOLN
INTRAMUSCULAR | Status: DC | PRN
  Administered 2017-06-08: 10 mg via INTRAVENOUS

## 2017-06-08 MED ORDER — ACETAMINOPHEN 500 MG PO TABS: 1000.0000 mg | ORAL_TABLET | Freq: Four times a day (QID) | ORAL | Status: DC | PRN

## 2017-06-08 MED ORDER — CARVEDILOL 25 MG PO TABS
25.0000 mg | ORAL_TABLET | Freq: Two times a day (BID) | ORAL | Status: DC
  Administered 2017-06-08: 25 mg via ORAL
  Filled 2017-06-08: qty 1

## 2017-06-08 MED ORDER — FENTANYL CITRATE (PF) 100 MCG/2ML IJ SOLN
INTRAMUSCULAR | Status: DC | PRN
  Administered 2017-06-08: 100 ug via INTRAVENOUS
  Administered 2017-06-08: 50 ug via INTRAVENOUS

## 2017-06-08 SURGICAL SUPPLY — 38 items
APPLIER CLIP ROT 10 11.4 M/L (STAPLE) ×2
CANISTER SUCT 1200ML W/VALVE (MISCELLANEOUS) ×2 IMPLANT
CANNULA DILATOR 10 W/SLV (CANNULA) ×4 IMPLANT
CATH REDDICK CHOLANGI 4FR 50CM (CATHETERS) ×2 IMPLANT
CHLORAPREP W/TINT 26ML (MISCELLANEOUS) ×2 IMPLANT
CLIP APPLIE ROT 10 11.4 M/L (STAPLE) ×1 IMPLANT
DRAPE SHEET LG 3/4 BI-LAMINATE (DRAPES) ×2 IMPLANT
ELECT REM PT RETURN 9FT ADLT (ELECTROSURGICAL) ×2
ELECTRODE REM PT RTRN 9FT ADLT (ELECTROSURGICAL) ×1 IMPLANT
GAUZE SPONGE 4X4 12PLY STRL (GAUZE/BANDAGES/DRESSINGS) ×2 IMPLANT
GLOVE BIO SURGEON STRL SZ7.5 (GLOVE) ×2 IMPLANT
GOWN STRL REUS W/ TWL LRG LVL3 (GOWN DISPOSABLE) ×4 IMPLANT
GOWN STRL REUS W/TWL LRG LVL3 (GOWN DISPOSABLE) ×4
IRRIGATION STRYKERFLOW (MISCELLANEOUS) ×1 IMPLANT
IRRIGATOR STRYKERFLOW (MISCELLANEOUS) ×2
IV NS 1000ML (IV SOLUTION) ×1
IV NS 1000ML BAXH (IV SOLUTION) ×1 IMPLANT
KIT TURNOVER KIT A (KITS) ×2 IMPLANT
LABEL OR SOLS (LABEL) ×2 IMPLANT
LOOP SUT CHROMIC 0 SGL3 (SUTURE) ×2 IMPLANT
NDL INSUFF ACCESS 14 VERSASTEP (NEEDLE) ×2 IMPLANT
NEEDLE FILTER BLUNT 18X 1/2SAF (NEEDLE) ×1
NEEDLE FILTER BLUNT 18X1 1/2 (NEEDLE) ×1 IMPLANT
NS IRRIG 500ML POUR BTL (IV SOLUTION) ×2 IMPLANT
PACK LAP CHOLECYSTECTOMY (MISCELLANEOUS) ×2 IMPLANT
RETRACT II ENDO 10MM 32CML (ENDOMECHANICALS) ×2
RETRACTOR II ENDO 10MM 32CML (ENDOMECHANICALS) ×1 IMPLANT
SCISSORS METZENBAUM CVD 33 (INSTRUMENTS) ×2 IMPLANT
SEAL FOR SCOPE WARMER C3101 (MISCELLANEOUS) ×2 IMPLANT
SLEEVE ENDOPATH XCEL 5M (ENDOMECHANICALS) ×2 IMPLANT
STRIP CLOSURE SKIN 1/2X4 (GAUZE/BANDAGES/DRESSINGS) ×2 IMPLANT
SUT CHROMIC 5 0 RB 1 27 (SUTURE) ×2 IMPLANT
SUT VIC AB 0 CT2 27 (SUTURE) IMPLANT
SYR 3ML LL SCALE MARK (SYRINGE) ×2 IMPLANT
TROCAR XCEL NON-BLD 11X100MML (ENDOMECHANICALS) ×2 IMPLANT
TROCAR XCEL NON-BLD 5MMX100MML (ENDOMECHANICALS) ×2 IMPLANT
TUBING INSUFFLATION (TUBING) ×2 IMPLANT
WATER STERILE IRR 1000ML POUR (IV SOLUTION) ×2 IMPLANT

## 2017-06-08 NOTE — Transfer of Care (Signed)
Immediate Anesthesia Transfer of Care Note  Patient: Martin Hardy.  Procedure(s) Performed: LAPAROSCOPIC CHOLECYSTECTOMY (N/A )  Patient Location: PACU  Anesthesia Type:General  Level of Consciousness: awake, alert  and oriented  Airway & Oxygen Therapy: Patient Spontanous Breathing and Patient connected to face mask oxygen  Post-op Assessment: Report given to RN and Post -op Vital signs reviewed and stable  Post vital signs: Reviewed and stable  Last Vitals:  Vitals:   06/08/17 0828 06/08/17 1336  BP: (!) 178/81 (!) 150/83  Pulse: 60 64  Resp: 16 12  Temp: (!) 36.3 C 36.7 C  SpO2: 97% 98%    Last Pain:  Vitals:   06/08/17 1336  TempSrc: Temporal  PainSc:          Complications: No apparent anesthesia complications

## 2017-06-08 NOTE — Anesthesia Post-op Follow-up Note (Signed)
Anesthesia QCDR form completed.        

## 2017-06-08 NOTE — H&P (Signed)
  He comes today prepared for laparoscopic cholecystectomy.  He was seen and evaluated yesterday and had findings of acute cholecystitis currently on dialysis.  He reports good progress overnight with minimal discomfort this morning.  No change in overall condition.  Lab work was reviewed.  Platelet count is 147,000  On exam there is localized right upper quadrant tenderness.  I discussed the plan for a laparoscopic cholecystectomy

## 2017-06-08 NOTE — Progress Notes (Signed)
He reports moderate discomfort after surgery.  He is breathing easily.  He has been sipping on some clear liquids.  Vital signs are stable.  Dressings are dry.  I discussed the operative findings.  I recommended an overnight period of observation.  I have prescribed Librium to help avoid alcohol withdrawal symptoms while in the hospital.

## 2017-06-08 NOTE — Anesthesia Procedure Notes (Signed)
Procedure Name: Intubation Date/Time: 06/08/2017 9:44 AM Performed by: Hedda Slade, CRNA Pre-anesthesia Checklist: Patient identified, Patient being monitored, Timeout performed, Emergency Drugs available and Suction available Patient Re-evaluated:Patient Re-evaluated prior to induction Oxygen Delivery Method: Circle system utilized Preoxygenation: Pre-oxygenation with 100% oxygen Induction Type: IV induction Ventilation: Mask ventilation without difficulty Laryngoscope Size: Mac, 4 and McGraph Grade View: Grade I Tube type: Oral Tube size: 7.5 mm Number of attempts: 2 Airway Equipment and Method: Stylet Placement Confirmation: ETT inserted through vocal cords under direct vision,  positive ETCO2 and breath sounds checked- equal and bilateral Secured at: 21 cm Tube secured with: Tape Dental Injury: Teeth and Oropharynx as per pre-operative assessment  Difficulty Due To: Difficult Airway- due to reduced neck mobility and Difficult Airway- due to limited oral opening Comments: Grade iv view with DL- Grade 1 view with Mcgrath MAC 4

## 2017-06-08 NOTE — Anesthesia Preprocedure Evaluation (Addendum)
Anesthesia Evaluation  Patient identified by MRN, date of birth, ID band Patient awake    Reviewed: Allergy & Precautions, NPO status , Patient's Chart, lab work & pertinent test results, reviewed documented beta blocker date and time   Airway Mallampati: III  TM Distance: >3 FB     Dental  (+) Chipped   Pulmonary sleep apnea and Continuous Positive Airway Pressure Ventilation ,    Pulmonary exam normal        Cardiovascular hypertension, Pt. on medications and Pt. on home beta blockers +CHF  Normal cardiovascular exam+ dysrhythmias + pacemaker + Cardiac Defibrillator      Neuro/Psych PSYCHIATRIC DISORDERS Depression  Neuromuscular disease    GI/Hepatic PUD, GERD  Controlled,(+)     substance abuse  alcohol use,   Endo/Other    Renal/GU  Bladder dysfunction      Musculoskeletal  (+) Arthritis ,   Abdominal (+) + obese,   Peds  Hematology   Anesthesia Other Findings Obese. Hx of ETOH. Low plts. Hypertensive. Hx of multiple PVCs.   Reproductive/Obstetrics                             Anesthesia Physical  Anesthesia Plan  ASA: III  Anesthesia Plan: General   Post-op Pain Management:    Induction:   PONV Risk Score and Plan:   Airway Management Planned: Oral ETT  Additional Equipment:   Intra-op Plan:   Post-operative Plan: Extubation in OR  Informed Consent: I have reviewed the patients History and Physical, chart, labs and discussed the procedure including the risks, benefits and alternatives for the proposed anesthesia with the patient or authorized representative who has indicated his/her understanding and acceptance.     Plan Discussed with: CRNA and Surgeon  Anesthesia Plan Comments:         Anesthesia Quick Evaluation

## 2017-06-08 NOTE — OR Nursing (Signed)
Patient has a pacemaker.  

## 2017-06-08 NOTE — Op Note (Signed)
OPERATIVE REPORT  PREOPERATIVE DIAGNOSIS: Acute cholecystitis cholelithiasis  POSTOPERATIVE DIAGNOSIS: Acute cholecystitis cholelithiasis  PROCEDURE: Laparoscopic cholecystectomy with cholangiogram  ANESTHESIA: General  SURGEON: Rochel Brome M.D.  INDICATIONS: He came with recent onset of right upper quadrant abdominal pains.  And physical findings of right upper quadrant tenderness.  He had ultrasound findings of gallstones with thickened gallbladder wall and distention of the gallbladder.  With the patient on the operating table in the supine position under general endotracheal anesthesia the abdomen was prepared with ChloraPrep solution and draped in a sterile manner. A short incision was made in the inferior aspect of the umbilicus and carried down to the deep fascia which was grasped with a laryngeal hook. A Veress needle was inserted aspirated and irrigated with a saline solution. The peritoneal cavity was insufflated with carbon dioxide. The Veress needle was removed. The 10 mm cannula was inserted. The 10 mm 0 laparoscope was inserted to view the peritoneal cavity.   2 incisions were made in the lateral aspect of the right upper quadrant to introduce 2   5 mm cannulas.  There were a number of adhesions in the anterior abdominal midline between the omentum and the abdominal wall.  Multiple adhesions were taken down with blunt and sharp dissection.  Subsequently an 11 mm cannula was inserted slightly to the right of the midline below the costal margin. Inspection revealed there was a phlegmon surrounding the gallbladder.  Dissection of the phlegmon was began with peeling it off the gallbladder and a  tedious blunt and sharp dissection and multiple small bleeding points were cauterized.  The site was irrigated multiple times with heparinized saline solution and suctioned.  There was thickening of the gallbladder wall.  The gallbladder was retracted towards the right shoulder.  The gallbladder  neck was retracted inferiorly and laterally.  Another 10 mm port was inserted to insert a 5 finger fan retractor to retract the phlegmon for exposure.  Also the 30 degree laparoscope was used for a portion of the procedure.  The porta hepatis was identified. The gallbladder was mobilized with incision of the visceral peritoneum. The cystic duct was dissected free from surrounding structures. The cystic artery was dissected free from surrounding structures. A critical view of safety was demonstrated  An Endo Clip was placed across the cystic duct adjacent to the gallbladder neck. An incision was made in the cystic duct to introduce a Reddick catheter.  At this point the catheter would not thread in.  The incision in the cystic duct was enlarged and with some manipulation and stone was directed from the cystic duct.  Subsequently the Reddick catheter was inserted and secured with a balloon.  The cholangiogram was done with injection of half-strength Conray 60 dye. This demonstrated the bile ducts and flow of dye into the duodenum. No retained stones were identified. The cholangiogram appeared normal. The Reddick catheter was removed. The cystic duct was doubly ligated with endoclips and divided.  Next the cystic duct was further ligated with a 0 chromic Endoloop.  The cystic artery was controlled with double endoclips and divided. The gallbladder was dissected free from the liver with use of hook and cautery and blunt dissection. Bleeding was moderate and multiple bleeding points were cauterized and subsequently hemostasis was intact.  The gallbladder was placed into an Endo Catch bag and pulled up to the infraumbilical incision.  The incision was lengthened to approximately 3 cm.  Also the fascial defect was lengthened to approximately 3 cm.  Gallbladder was opened and suctioned multiple stones were removed with the stone scope and with additional traction and manipulation the gallbladder was removed and submitted  with stones for routine pathology.  The fascia at the site was closed with interrupted 0 Maxon figure-of-eight sutures at the 10 mm cannula was then reinserted prior to tying down the last suture to reinspect the abdomen.  The right upper quadrant was further irrigated and aspirated several tiny bleeding points were cauterized.  This was observed for a brief period of time to watch for any bleeding.  There was no bile leakage.  The cannulas were removed allowing carbon dioxide to escape from the peritoneal cavity.  Several tiny bleeding points were cauterized.  The subcutaneous tissues were infiltrated with 0.5% Sensorcaine with epinephrine.  Hemostasis was subsequently intact. The skin incisions were closed with interrupted 5-0 and 4-0 chromic subcutaneous suture benzoin and Steri-Strips. Gauze dressings were applied with paper tape.  The patient appeared to be in satisfactory condition and was prepared for transfer to the recovery room.  This operation was more difficult than the typical gallbladder operation lasting 3-1/2 hours.  He had acute cholecystitis with much inflammation.  He also was taking aspirin and tended to ooze easily.  Rochel Brome M.D.

## 2017-06-08 NOTE — OR Nursing (Signed)
DR. Kayleen Memos notified of Icd , rep. Roy from  [employer] notified , magnet to be used.

## 2017-06-08 NOTE — Anesthesia Postprocedure Evaluation (Signed)
Anesthesia Post Note  Patient: Martin Hardy.  Procedure(s) Performed: LAPAROSCOPIC CHOLECYSTECTOMY (N/A )  Patient location during evaluation: PACU Anesthesia Type: General Level of consciousness: awake and alert and oriented Pain management: pain level controlled Vital Signs Assessment: post-procedure vital signs reviewed and stable Respiratory status: spontaneous breathing Cardiovascular status: blood pressure returned to baseline Anesthetic complications: no     Last Vitals:  Vitals:   06/08/17 0828 06/08/17 1336  BP: (!) 178/81 (!) 150/83  Pulse: 60 64  Resp: 16 12  Temp: (!) 36.3 C 36.7 C  SpO2: 97% 98%    Last Pain:  Vitals:   06/08/17 1336  TempSrc: Temporal  PainSc:                  [name]

## 2017-06-09 ENCOUNTER — Encounter: Payer: Self-pay | Admitting: Surgery

## 2017-06-09 DIAGNOSIS — K8010 Calculus of gallbladder with chronic cholecystitis without obstruction: Secondary | ICD-10-CM | POA: Diagnosis not present

## 2017-06-09 MED ORDER — HYDROCODONE-ACETAMINOPHEN 5-325 MG PO TABS: 1.0000 | ORAL_TABLET | Freq: Four times a day (QID) | ORAL | 0 refills | Status: DC | PRN

## 2017-06-09 NOTE — Care Management Obs Status (Signed)
[place] NOTIFICATION   Patient Details  Name: Martin Hardy. MRN: [id number] Date of Birth: Oct 27, 1945   Medicare Observation Status Notification Given:  No(admitted obs less than 24 hours)    Joshus Rogan T, RN 06/09/2017, 10:00 AM

## 2017-06-09 NOTE — Progress Notes (Signed)
Discharge instructions reviewed with the patient.  Patient sent out via wheelchair to his wife's waiting car 

## 2017-06-09 NOTE — Discharge Summary (Signed)
He was brought in through the outpatient surgery department with right upper quadrant pain tenderness and ultrasound findings of cholecystitis cholelithiasis.  He had laparoscopic cholecystectomy.  He was kept overnight for a period of observation.  He was given Librium to help avoid alcohol withdrawal.  He was given Norco to ease his postoperative pain.  He has tolerated clear liquids well.  He is walking in the hallway.  Vital signs are stable.  Abdomen is with mild right upper quadrant tenderness.  Dressings were changed.  Steri-Strips are dry and intact.  Diagnosis acute cholecystitis cholelithiasis, alcohol dependence  Plan is to discharge home.  Take a regular diet.  Avoid straining and heavy lifting.  Follow-up in the office

## 2017-06-09 NOTE — Discharge Instructions (Addendum)
Take Tylenol or Norco if needed for pain.  Should not drive or do anything dangerous when taking Norco.  Remove dressings on Thursday.  May then shower.  Steri-Strips will likely stick for about a week and eventually peel off.  Avoid straining and heavy lifting anticipating return to work in 2 weeks.

## 2017-06-10 LAB — SURGICAL PATHOLOGY

## 2018-10-27 ENCOUNTER — Other Ambulatory Visit: Payer: Self-pay | Admitting: Surgery

## 2018-10-27 ENCOUNTER — Other Ambulatory Visit (HOSPITAL_COMMUNITY): Payer: Self-pay | Admitting: Surgery

## 2018-10-27 DIAGNOSIS — M5440 Lumbago with sciatica, unspecified side: Secondary | ICD-10-CM

## 2018-11-10 ENCOUNTER — Other Ambulatory Visit: Payer: Self-pay | Admitting: Surgery

## 2018-11-10 DIAGNOSIS — M4316 Spondylolisthesis, lumbar region: Secondary | ICD-10-CM

## 2018-11-18 ENCOUNTER — Ambulatory Visit
Admission: RE | Admit: 2018-11-18 | Discharge: 2018-11-18 | Disposition: A | Discharge status: Home / Self Care | Payer: Medicare Other | Source: Ambulatory Visit | Attending: Surgery | Admitting: Surgery

## 2018-11-18 ENCOUNTER — Other Ambulatory Visit: Payer: Self-pay

## 2018-11-18 ENCOUNTER — Other Ambulatory Visit: Payer: Self-pay | Admitting: Surgery

## 2018-11-18 ENCOUNTER — Ambulatory Visit: Payer: Medicare Other

## 2018-11-18 DIAGNOSIS — M4316 Spondylolisthesis, lumbar region: Secondary | ICD-10-CM | POA: Diagnosis present

## 2018-11-18 NOTE — Progress Notes (Signed)
Patient was unable to complete Myelogram today due to confusion of medications allowed to take prior to procedure. Dr. Posey Pronto (radiology) notified.  Order for CT lumbar spine was entered per Dr. Nicholaus Bloom previous order.  Patient with understanding of plan and need for follow up at Dr. Nicholaus Bloom office.

## 2018-11-28 ENCOUNTER — Other Ambulatory Visit: Payer: Self-pay

## 2018-11-28 ENCOUNTER — Emergency Department
Admission: EM | Admit: 2018-11-28 | Discharge: 2018-11-28 | Disposition: A | Discharge status: Home / Self Care | Payer: Medicare Other | Attending: Student in an Organized Health Care Education/Training Program | Admitting: Student in an Organized Health Care Education/Training Program

## 2018-11-28 DIAGNOSIS — Z20828 Contact with and (suspected) exposure to other viral communicable diseases: Secondary | ICD-10-CM | POA: Insufficient documentation

## 2018-11-28 DIAGNOSIS — I110 Hypertensive heart disease with heart failure: Secondary | ICD-10-CM | POA: Insufficient documentation

## 2018-11-28 DIAGNOSIS — R0989 Other specified symptoms and signs involving the circulatory and respiratory systems: Secondary | ICD-10-CM | POA: Insufficient documentation

## 2018-11-28 DIAGNOSIS — I509 Heart failure, unspecified: Secondary | ICD-10-CM | POA: Diagnosis not present

## 2018-11-28 DIAGNOSIS — Z7982 Long term (current) use of aspirin: Secondary | ICD-10-CM | POA: Insufficient documentation

## 2018-11-28 DIAGNOSIS — Z79899 Other long term (current) drug therapy: Secondary | ICD-10-CM | POA: Insufficient documentation

## 2018-11-28 DIAGNOSIS — Z20822 Contact with and (suspected) exposure to covid-19: Secondary | ICD-10-CM

## 2018-11-28 NOTE — ED Notes (Signed)
See triage note   States he is here for COVID testing    States he lives with son that has been exposed

## 2018-11-28 NOTE — ED Triage Notes (Signed)
Pt states his son that lives with them had 2 coworkers that tested +covid, states he got tested on Saturday but does not have his results yet. Pt states he has had some sinus congestion but that is not unusual for him. Denies any other sx.

## 2018-11-28 NOTE — ED Provider Notes (Signed)
[facility] Emergency Department Provider Note  ____________________________________________  Time seen: Approximately 5:04 PM  I have reviewed the triage vital signs and the nursing notes.   HISTORY  Chief Complaint Covid test   HPI Martin Hardy. is a 73 y.o. male who presents to the emergency department for COVID-19 testing.  His son who lives at home has had exposure at his job.  Patient states that he has had some unusual chest congestion and nasal drainage.  He denies any fever, cough, shortness of breath, chest pain, nausea, vomiting, diarrhea, loss of taste, or rash.  His son is symptom free but due to exposure was tested 2 days ago.  Results of the tests have not come back.  Patient wants to be tested before he returns to work.  Past Medical History:  Diagnosis Date  . AICD (automatic cardioverter/defibrillator) present   . Alcohol abuse   . Bladder tumor   . BPH (benign prostatic hyperplasia)   . Cancer [facility]) 2012   [place] resected colonoscopy  . Cardiomyopathy ([place])   . CHF (congestive heart failure) (HCC)    CHRONIC  . Depression   . Diverticulosis   . Dysrhythmia   . Enlarged heart   . Enlarged heart   . Epididymal mass   . GERD (gastroesophageal reflux disease)   . History of bleeding peptic ulcer   . History of blood transfusion   . Hx MRSA infection   . Hypertension   . Obesity   . Overweight   . Presence of permanent cardiac pacemaker   . PUD (peptic ulcer disease)   . Scrotal cyst   . Skin cancer    basal cell, shoulders chest and arms   . Skin cancer 2013   melanoma on chest  . Sleep apnea    cpap  . Spermatocele   . Urinary retention     Patient Active Problem List   Diagnosis Date Noted  . Acute cholecystitis due to biliary calculus 06/08/2017  . Congestive heart failure ([place]) 04/18/2015  . Alcohol dependence ([place]) 04/18/2015  . Thrombocytopenia ([place]) 04/18/2015  . Lumbar canal stenosis 11/13/2014  . Gross  hematuria 10/22/2014  . Status post recent transurethral resection of prostate 10/22/2014  . CCF (congestive cardiac failure) ([place]) 09/27/2014  . Alcohol addiction ([place]) 09/27/2014  . BP (high blood pressure) 09/27/2014  . Neuralgia neuritis, sciatic nerve 09/27/2014  . Change in blood platelet count 09/27/2014  . Apnea, sleep 02/28/2014  . Cardiac defibrillator in place 02/28/2014  . Cardiomyopathy ([place]) 02/28/2014  . H/O cardiac catheterization 02/28/2014  . HLD (hyperlipidemia) 02/28/2014  . History of cardiac catheterization 02/28/2014  . Benign fibroma of prostate 02/20/2014  . DDD (degenerative disc disease), lumbar 01/24/2014  . Neuritis or radiculitis due to rupture of lumbar intervertebral disc 01/22/2014    Past Surgical History:  Procedure Laterality Date  . CATARACT EXTRACTION, BILATERAL    . CHOLECYSTECTOMY N/A 06/08/2017   Procedure: LAPAROSCOPIC CHOLECYSTECTOMY;  Surgeon: Leonie Green, MD;  Location: [facility] ORS;  Service: General;  Laterality: N/A;  . COLONOSCOPY WITH PROPOFOL N/A 07/22/2016   Procedure: COLONOSCOPY WITH PROPOFOL;  Surgeon: Manya Silvas, MD;  Location: [facility] ENDOSCOPY;  Service: Endoscopy;  Laterality: N/A;  . EP IMPLANTABLE DEVICE    . EYE SURGERY Bilateral 2014   cataract  . FOOT NEUROMA SURGERY    . gastric ulcer    . GREEN LIGHT LASER TURP (TRANSURETHRAL RESECTION OF PROSTATE N/A 09/24/2014   Procedure: GREEN  LIGHT LASER TURP (TRANSURETHRAL RESECTION OF PROSTATE;  Surgeon: Collier Flowers, MD;  Location: [facility] ORS;  Service: Urology;  Laterality: N/A;  . HERNIA REPAIR    . IMPLANTABLE CARDIOVERTER DEFIBRILLATOR (ICD) GENERATOR CHANGE N/A 12/03/2016   Procedure: BIV ICD GENERATOR CHANGE;  Surgeon: [name], MD;  Location: [facility] ORS;  Service: Cardiovascular;  Laterality: N/A;  ICD  generator discarded per protocall  . SHOULDER ARTHROSCOPY WITH OPEN ROTATOR CUFF REPAIR Left 06/13/2015   Procedure: SHOULDER ARTHROSCOPIC debridement and  decompression and repair of rotator cuff tear;  Surgeon: Corky Mull, MD;  Location: [facility] ORS;  Service: Orthopedics;  Laterality: Left;  . SHOULDER ARTHROSCOPY WITH OPEN ROTATOR CUFF REPAIR Left 01/09/2016   Procedure: SHOULDER ARTHROSCOPY, DEBRIDEMENT AND WITH OPEN ROTATOR CUFF REPAIR;  Surgeon: Corky Mull, MD;  Location: [facility] ORS;  Service: Orthopedics;  Laterality: Left;  . TRIGGER FINGER RELEASE Right 05/13/2016   Procedure: RELEASE TRIGGER FINGER right ring finger;  Surgeon: Corky Mull, MD;  Location: [place];  Service: Orthopedics;  Laterality: Right;  sleep apnea  . VAGOTOMY      Prior to Admission medications   Medication Sig Start Date End Date Taking? Authorizing Provider  acetaminophen (TYLENOL) 500 MG tablet Take 1,000 mg by mouth every 6 (six) hours as needed for moderate pain or headache.    [provider]  aspirin EC 81 MG tablet Take 81 mg by mouth daily.     [provider]  carvedilol (COREG) 25 MG tablet Take 25 mg by mouth 2 (two) times daily.     [provider]  hydrochlorothiazide (HYDRODIURIL) 25 MG tablet Take 25 mg by mouth daily.    [provider]  lisinopril (PRINIVIL,ZESTRIL) 40 MG tablet Take 40 mg by mouth daily.     [provider]  loratadine (CLARITIN) 10 MG tablet Take 10 mg by mouth daily as needed for allergies.    [provider]  omeprazole (PRILOSEC) 20 MG capsule Take 20 mg by mouth 2 (two) times daily.     [provider]  oxymetazoline (AFRIN) 0.05 % nasal spray Place 1 spray into both nostrils daily as needed for congestion.    [provider]  potassium chloride SA (K-DUR,KLOR-CON) 20 MEQ tablet Take 20 mEq by mouth every other day.     [provider]  pregabalin (LYRICA) 150 MG capsule Take 150 mg by mouth at bedtime.     [provider]  tamsulosin (FLOMAX) 0.4 MG CAPS capsule Take 0.4 mg by mouth daily.     [provider]  traZODone  (DESYREL) 50 MG tablet Take 50 mg by mouth at bedtime.    [provider]  venlafaxine XR (EFFEXOR-XR) 75 MG 24 hr capsule Take 75 mg by mouth daily. 11/02/16   [provider]    Allergies Patient has no known allergies.  Family History  Problem Relation Age of Onset  . COPD Mother   . Hypertension Mother        Father  . Prostate cancer Neg Hx   . Kidney cancer Neg Hx   . Bladder Cancer Neg Hx     Social History Social History   Tobacco Use  . Smoking status: Never Smoker  . Smokeless tobacco: Former Systems developer    Types: Snuff  Substance Use Topics  . Alcohol use: Yes    Alcohol/week: 39.0 standard drinks    Types: 39 Cans of beer per week    Comment:    .  Drug use: No    Review of Systems Constitutional: Negative for fever. ENT: Negative for sore throat.  Positive for chest congestion. Respiratory: Negative for cough Gastrointestinal: No abdominal pain.  No nausea, no vomiting.  No diarrhea.  Musculoskeletal: Negative for generalized body aches. Skin: Negative for rash/lesion/wound. Neurological: Negative for headaches, focal weakness or numbness.  ____________________________________________   PHYSICAL EXAM:  VITAL SIGNS: ED Triage Vitals  Enc Vitals Group     BP 11/28/18 1049 (!) 168/86     Pulse Rate 11/28/18 1049 70     Resp 11/28/18 1049 17     Temp 11/28/18 1049 98.9 F (37.2 C)     Temp Source 11/28/18 1049 Oral     SpO2 11/28/18 1049 96 %     Weight 11/28/18 1050 215 lb (97.5 kg)     Height 11/28/18 1050 5\' 9"  (1.753 m)     Head Circumference --      Peak Flow --      Pain Score 11/28/18 1050 0     Pain Loc --      Pain Edu? --      Excl. in [place]? --     Constitutional: Alert and oriented. Well appearing and in no acute distress. Eyes: Conjunctivae are normal. PERRL. EOMI. Head: Atraumatic. Nose: No congestion/rhinnorhea. Mouth/Throat: Mucous membranes are moist. Neck: No stridor.  Cardiovascular: Normal rate, regular rhythm.  Good peripheral circulation. Respiratory: Normal respiratory effort. Musculoskeletal: Full ROM throughout.  Neurologic:  Normal speech and language. No gross focal neurologic deficits are appreciated. Speech is normal. No gait instability. Skin:  Skin is warm, dry and intact. No rash noted. Psychiatric: Mood and affect are normal. Speech and behavior are normal.  ____________________________________________   LABS (all labs ordered are listed, but only abnormal results are displayed)  Labs Reviewed  NOVEL CORONAVIRUS, NAA (HOSPITAL ORDER, SEND-OUT TO REF LAB)   ____________________________________________  EKG  Not indicated ____________________________________________  RADIOLOGY  Not indicated ____________________________________________   PROCEDURES  None ____________________________________________   INITIAL IMPRESSION / ASSESSMENT AND PLAN / ED COURSE     Pertinent labs & imaging results that were available during my care of the patient were reviewed by me and considered in my medical decision making (see chart for details).  Patient was advised that his COVID-19 screening should be back within the next couple of days.  Quarantine instructions were provided upon discharge.  [name]. was evaluated in Emergency Department on 11/28/2018 for the symptoms described in the history of present illness. He was evaluated in the context of the global COVID-19 pandemic, which necessitated consideration that the patient might be at risk for infection with the SARS-CoV-2 virus that causes COVID-19. Institutional protocols and algorithms that pertain to the evaluation of patients at risk for COVID-19 are in a state of rapid change based on information released by regulatory bodies including the CDC and federal and state organizations. These policies and algorithms were followed during the patient's care in the ED.  ____________________________________________   FINAL  CLINICAL IMPRESSION(S) / ED DIAGNOSES  Final diagnoses:  Exposure to Covid-19 Virus       Victorino Dike, FNP 11/28/18 1718    Merlyn Lot, MD 11/29/18 1801

## 2018-11-29 LAB — NOVEL CORONAVIRUS, NAA (HOSP ORDER, SEND-OUT TO REF LAB; TAT 18-24 HRS): SARS-CoV-2, NAA: NOT DETECTED

## 2019-08-21 ENCOUNTER — Encounter (INDEPENDENT_AMBULATORY_CARE_PROVIDER_SITE_OTHER): Payer: Medicare Other | Admitting: Ophthalmology

## 2019-12-28 ENCOUNTER — Other Ambulatory Visit: Payer: Self-pay | Admitting: Infectious Diseases

## 2019-12-28 DIAGNOSIS — R1031 Right lower quadrant pain: Secondary | ICD-10-CM

## 2020-01-04 ENCOUNTER — Other Ambulatory Visit: Payer: Self-pay

## 2020-01-04 ENCOUNTER — Ambulatory Visit
Admission: RE | Admit: 2020-01-04 | Discharge: 2020-01-04 | Disposition: A | Discharge status: Home / Self Care | Payer: Medicare Other | Source: Ambulatory Visit | Attending: Infectious Diseases | Admitting: Infectious Diseases

## 2020-01-04 DIAGNOSIS — R1031 Right lower quadrant pain: Secondary | ICD-10-CM | POA: Insufficient documentation

## 2020-01-04 MED ORDER — IOHEXOL 300 MG/ML  SOLN
100.0000 mL | Freq: Once | INTRAMUSCULAR | Status: AC | PRN
  Administered 2020-01-04: 100 mL via INTRAVENOUS

## 2020-03-01 ENCOUNTER — Encounter: Payer: Self-pay | Admitting: Emergency Medicine

## 2020-03-01 ENCOUNTER — Other Ambulatory Visit: Payer: Self-pay

## 2020-03-01 ENCOUNTER — Emergency Department
Admission: EM | Admit: 2020-03-01 | Discharge: 2020-03-01 | Disposition: A | Discharge status: Home / Self Care | Payer: Medicare Other | Attending: Emergency Medicine | Admitting: Emergency Medicine

## 2020-03-01 ENCOUNTER — Emergency Department: Payer: Medicare Other

## 2020-03-01 DIAGNOSIS — Z5321 Procedure and treatment not carried out due to patient leaving prior to being seen by health care provider: Secondary | ICD-10-CM | POA: Insufficient documentation

## 2020-03-01 DIAGNOSIS — R079 Chest pain, unspecified: Secondary | ICD-10-CM | POA: Diagnosis present

## 2020-03-01 LAB — TROPONIN I (HIGH SENSITIVITY): Troponin I (High Sensitivity): 5 ng/L (ref <18)

## 2020-03-01 LAB — COMPREHENSIVE METABOLIC PANEL
ALT: 22 U/L (ref 0–44)
AST: 21 U/L (ref 15–41)
Albumin: 3.7 g/dL (ref 3.5–5.0)
Alkaline Phosphatase: 63 U/L (ref 38–126)
Anion gap: 9 (ref 5–15)
BUN: 11 mg/dL (ref 8–23)
CO2: 24 mmol/L (ref 22–32)
Calcium: 9.4 mg/dL (ref 8.9–10.3)
Chloride: 105 mmol/L (ref 98–111)
Creatinine, Ser: 0.78 mg/dL (ref 0.61–1.24)
GFR, Estimated: >60 mL/min (ref >60)
Glucose, Bld: 139 mg/dL — ABNORMAL HIGH (ref 70–99)
Potassium: 3.3 mmol/L — ABNORMAL LOW (ref 3.5–5.1)
Sodium: 138 mmol/L (ref 135–145)
Total Bilirubin: 0.8 mg/dL (ref 0.3–1.2)
Total Protein: 7.1 g/dL (ref 6.5–8.1)

## 2020-03-01 LAB — CBC WITH DIFFERENTIAL/PLATELET
Abs Immature Granulocytes: 0.02 10*3/uL (ref 0.00–0.07)
Basophils Absolute: 0.1 10*3/uL (ref 0.0–0.1)
Basophils Relative: 1 %
Eosinophils Absolute: 0.3 10*3/uL (ref 0.0–0.5)
Eosinophils Relative: 5 %
HCT: 45.2 % (ref 39.0–52.0)
Hemoglobin: 15.3 g/dL (ref 13.0–17.0)
Immature Granulocytes: 0 %
Lymphocytes Relative: 38 %
Lymphs Abs: 2.1 10*3/uL (ref 0.7–4.0)
MCH: 29.7 pg (ref 26.0–34.0)
MCHC: 33.8 g/dL (ref 30.0–36.0)
MCV: 87.6 fL (ref 80.0–100.0)
Monocytes Absolute: 0.5 10*3/uL (ref 0.1–1.0)
Monocytes Relative: 9 %
Neutro Abs: 2.7 10*3/uL (ref 1.7–7.7)
Neutrophils Relative %: 47 %
Platelets: 143 10*3/uL — ABNORMAL LOW (ref 150–400)
RBC: 5.16 MIL/uL (ref 4.22–5.81)
RDW: 14.3 % (ref 11.5–15.5)
WBC: 5.6 10*3/uL (ref 4.0–10.5)
nRBC: 0 % (ref 0.0–0.2)

## 2020-03-01 NOTE — ED Triage Notes (Signed)
Patient ambulatory to triage with steady gait, without difficulty or distress noted; pt reports awoke 54min pta with left sided CP radiating into left arm with no accomp symptoms

## 2020-04-24 ENCOUNTER — Other Ambulatory Visit: Payer: Self-pay | Admitting: Physician Assistant

## 2020-04-24 ENCOUNTER — Other Ambulatory Visit: Payer: Self-pay

## 2020-04-24 ENCOUNTER — Ambulatory Visit
Admission: RE | Admit: 2020-04-24 | Discharge: 2020-04-24 | Disposition: A | Discharge status: Home / Self Care | Payer: Medicare Other | Source: Ambulatory Visit | Attending: Physician Assistant | Admitting: Physician Assistant

## 2020-04-24 ENCOUNTER — Other Ambulatory Visit (HOSPITAL_COMMUNITY): Payer: Self-pay | Admitting: Physician Assistant

## 2020-04-24 DIAGNOSIS — R55 Syncope and collapse: Secondary | ICD-10-CM

## 2020-04-24 DIAGNOSIS — S065XAA Traumatic subdural hemorrhage with loss of consciousness status unknown, initial encounter: Secondary | ICD-10-CM

## 2020-04-24 DIAGNOSIS — S065X9A Traumatic subdural hemorrhage with loss of consciousness of unspecified duration, initial encounter: Secondary | ICD-10-CM

## 2020-04-25 ENCOUNTER — Ambulatory Visit
Admission: RE | Admit: 2020-04-25 | Discharge: 2020-04-25 | Disposition: A | Discharge status: Home / Self Care | Payer: Medicare Other | Source: Ambulatory Visit | Attending: Physician Assistant | Admitting: Physician Assistant

## 2020-04-25 DIAGNOSIS — S065X9A Traumatic subdural hemorrhage with loss of consciousness of unspecified duration, initial encounter: Secondary | ICD-10-CM

## 2020-04-25 DIAGNOSIS — S065XAA Traumatic subdural hemorrhage with loss of consciousness status unknown, initial encounter: Secondary | ICD-10-CM

## 2020-05-07 ENCOUNTER — Ambulatory Visit: Payer: Medicare Other | Attending: Internal Medicine

## 2020-05-07 ENCOUNTER — Other Ambulatory Visit: Payer: Self-pay | Admitting: Internal Medicine

## 2020-05-07 DIAGNOSIS — Z23 Encounter for immunization: Secondary | ICD-10-CM

## 2020-05-07 NOTE — Progress Notes (Signed)
   [id number]  Name:  Martin Hardy.    MRN: [id number] DOB: [birth year]  05/07/2020  Mr. [name] was observed post Covid-19 immunization for 15 minutes without incident. He was provided with Vaccine Information Sheet and instruction to access the V-Safe system.   Mr. [name] was instructed to call 911 with any severe reactions post vaccine: Marland Kitchen Difficulty breathing  . Swelling of face and throat  . A fast heartbeat  . A bad rash all over body  . Dizziness and weakness   Immunizations Administered    Name Date Dose VIS Date Route   Pfizer COVID-19 Vaccine 05/07/2020  1:30 PM 0.3 mL 02/21/2020 Intramuscular   Manufacturer: [employer], [employer]   Lot: [id number]   NDC: [address]-3

## 2020-12-11 ENCOUNTER — Other Ambulatory Visit: Payer: Self-pay | Admitting: Infectious Diseases

## 2020-12-11 DIAGNOSIS — R1032 Left lower quadrant pain: Secondary | ICD-10-CM

## 2020-12-20 ENCOUNTER — Other Ambulatory Visit: Payer: Self-pay

## 2020-12-20 ENCOUNTER — Ambulatory Visit
Admission: RE | Admit: 2020-12-20 | Discharge: 2020-12-20 | Disposition: A | Discharge status: Home / Self Care | Payer: Medicare Other | Source: Ambulatory Visit | Attending: Infectious Diseases | Admitting: Infectious Diseases

## 2020-12-20 DIAGNOSIS — R1032 Left lower quadrant pain: Secondary | ICD-10-CM | POA: Diagnosis not present

## 2020-12-20 MED ORDER — IOHEXOL 350 MG/ML SOLN
100.0000 mL | Freq: Once | INTRAVENOUS | Status: AC | PRN
  Administered 2020-12-20: 100 mL via INTRAVENOUS

## 2021-03-03 NOTE — Progress Notes (Signed)
03/04/21 2:39 PM   Martin Hardy 05-05-1945 [id number]  Referring provider:  [name], MD [place],  [place] [address] Chief Complaint  Patient presents with   Urinary Tract Infection     HPI: Martin Hardy. is a 75 y.o.male who presents today for further evaluation of urinary tract infection. He is previously well known to our clinic for history of urinary retention and recurrent epididymis, and BPH.   He is a history of BPH and previously underwent greenlight laser ablation of the prostate with Dr. [name] in 2016.  Personal history of urinary retention. He was self cathing, due to alcohol abuse.  He reports that he has not had to do this for several years.  Urinalysis on 04/22/2020 was positive for infection. It grew E.coli.   12/20/2020 CT of abdomen and pelvis revealed mild sigmoid diverticulitis, mild hepatic steatosis, and mildly enlarged prostate.   His most recent urinalysis at [facility] [place] [facility] was on 01/15/2021 it showed moderate blood, small leukocytes, 10-0 WBCs, 4-10 RBCs, and rare bacteria. Urine culture grew E.coli.  He was treated with Macrobid.  He called his office a few weeks later with recurrent symptoms of dysuria.  No urine was checked at this time and he was prescribed Cipro which he since completed.  His symptoms completely resolved.  He is doing well today. He has not experiencing any new urinary symptoms. He is still drinking regularly; 4 beers and  shots daily.   Most recent PSA 1.76 on 09/01/2019.  He is NOT taking flomax.     IPSS     Row Name 03/04/21 1400         International Prostate Symptom Score   How often have you had the sensation of not emptying your bladder? Not at All     How often have you had to urinate less than every two hours? More than half the time     How often have you found you stopped and started again several times when you urinated? More than half the time     How often have you found it difficult  to postpone urination? More than half the time     How often have you had a weak urinary stream? Not at All     How often have you had to strain to start urination? Not at All     How many times did you typically get up at night to urinate? 2 Times     Total IPSS Score 14       Quality of Life due to urinary symptoms   If you were to spend the rest of your life with your urinary condition just the way it is now how would you feel about that? Pleased               PMH: Past Medical History:  Diagnosis Date   AICD (automatic cardioverter/defibrillator) present    Alcohol abuse    Bladder tumor    BPH (benign prostatic hyperplasia)    Cancer ([place]) 2012   Polyp resected colonoscopy   Cardiomyopathy ([place])    CHF (congestive heart failure) ([place])    CHRONIC   Depression    Diverticulosis    Dysrhythmia    Enlarged heart    Enlarged heart    Epididymal mass    GERD (gastroesophageal reflux disease)    History of bleeding peptic ulcer    History of blood transfusion    Hx  MRSA infection    Hypertension    Obesity    Overweight    Presence of permanent cardiac pacemaker    PUD (peptic ulcer disease)    Scrotal cyst    Skin cancer    basal cell, shoulders chest and arms    Skin cancer 2013   melanoma on chest   Sleep apnea    cpap   Spermatocele    Urinary retention     Surgical History: Past Surgical History:  Procedure Laterality Date   CATARACT EXTRACTION, BILATERAL     CHOLECYSTECTOMY N/A 06/08/2017   Procedure: LAPAROSCOPIC CHOLECYSTECTOMY;  Surgeon: Leonie Green, MD;  Location: [facility] ORS;  Service: General;  Laterality: N/A;   COLONOSCOPY WITH PROPOFOL N/A 07/22/2016   Procedure: COLONOSCOPY WITH PROPOFOL;  Surgeon: Manya Silvas, MD;  Location: [facility] ENDOSCOPY;  Service: Endoscopy;  Laterality: N/A;   EP IMPLANTABLE DEVICE     EYE SURGERY Bilateral 2014   cataract   FOOT NEUROMA SURGERY     gastric ulcer     GREEN LIGHT LASER TURP (TRANSURETHRAL  RESECTION OF PROSTATE N/A 09/24/2014   Procedure: GREEN LIGHT LASER TURP (TRANSURETHRAL RESECTION OF PROSTATE;  Surgeon: Collier Flowers, MD;  Location: [facility] ORS;  Service: Urology;  Laterality: N/A;   HERNIA REPAIR     IMPLANTABLE CARDIOVERTER DEFIBRILLATOR (ICD) GENERATOR CHANGE N/A 12/03/2016   Procedure: BIV ICD GENERATOR CHANGE;  Surgeon: [name], MD;  Location: [facility] ORS;  Service: Cardiovascular;  Laterality: N/A;  ICD  generator discarded per protocall   SHOULDER ARTHROSCOPY WITH OPEN ROTATOR CUFF REPAIR Left 06/13/2015   Procedure: SHOULDER ARTHROSCOPIC debridement and decompression and repair of rotator cuff tear;  Surgeon: Corky Mull, MD;  Location: [facility] ORS;  Service: Orthopedics;  Laterality: Left;   SHOULDER ARTHROSCOPY WITH OPEN ROTATOR CUFF REPAIR Left 01/09/2016   Procedure: SHOULDER ARTHROSCOPY, DEBRIDEMENT AND WITH OPEN ROTATOR CUFF REPAIR;  Surgeon: Corky Mull, MD;  Location: [facility] ORS;  Service: Orthopedics;  Laterality: Left;   TRIGGER FINGER RELEASE Right 05/13/2016   Procedure: RELEASE TRIGGER FINGER right ring finger;  Surgeon: Corky Mull, MD;  Location: [facility];  Service: Orthopedics;  Laterality: Right;  sleep apnea   VAGOTOMY      Home Medications:  Allergies as of 03/04/2021   No Known Allergies      Medication List        Accurate as of March 04, 2021  2:39 PM. If you have any questions, ask your nurse or doctor.          STOP taking these medications    acetaminophen 500 MG tablet Commonly known as: TYLENOL Stopped by: Hollice Espy, MD   loratadine 10 MG tablet Commonly known as: CLARITIN Stopped by: Hollice Espy, MD   tamsulosin 0.4 MG Caps capsule Commonly known as: FLOMAX Stopped by: Hollice Espy, MD       TAKE these medications    amLODipine 5 MG tablet Commonly known as: NORVASC Take 10 mg by mouth daily.   aspirin EC 81 MG tablet Take 81 mg by mouth daily.   carvedilol 25 MG tablet Commonly known  as: COREG Take 25 mg by mouth 2 (two) times daily.   hydrochlorothiazide 25 MG tablet Commonly known as: HYDRODIURIL Take 25 mg by mouth daily.   lisinopril 40 MG tablet Commonly known as: ZESTRIL Take 40 mg by mouth daily.   omeprazole 20 MG capsule Commonly known as: PRILOSEC Take 20 mg by mouth 2 (  two) times daily.   oxymetazoline 0.05 % nasal spray Commonly known as: AFRIN Place 1 spray into both nostrils daily as needed for congestion.   Pfizer-BioNTech COVID-19 Vacc 30 MCG/0.3ML injection Generic drug: COVID-19 mRNA vaccine (Pfizer) USE AS DIRECTED   potassium chloride SA 20 MEQ tablet Commonly known as: KLOR-CON Take 20 mEq by mouth every other day.   pregabalin 150 MG capsule Commonly known as: LYRICA Take 150 mg by mouth at bedtime.   traZODone 50 MG tablet Commonly known as: DESYREL Take 50 mg by mouth at bedtime.   venlafaxine XR 75 MG 24 hr capsule Commonly known as: EFFEXOR-XR Take 75 mg by mouth daily.        Allergies: No Known Allergies  Family History: Family History  Problem Relation Age of Onset   COPD Mother    Hypertension Mother        Father   Prostate cancer Neg Hx    Kidney cancer Neg Hx    Bladder Cancer Neg Hx     Social History:  reports that he has never smoked. He has quit using smokeless tobacco.  His smokeless tobacco use included snuff. He reports current alcohol use of about 39.0 standard drinks per week. He reports that he does not use drugs.   Physical Exam: BP 123/79   Pulse 79   Ht 5\' 9"  (1.753 m)   Wt 220 lb (99.8 kg)   BMI 32.49 kg/m   Constitutional:  Alert and oriented, No acute distress. HEENT: [place] AT, moist mucus membranes.  Trachea midline, no masses. Cardiovascular: No clubbing, cyanosis, or edema. Respiratory: Normal respiratory effort, no increased work of breathing. Skin: No rashes, bruises or suspicious lesions. Neurologic: Grossly intact, no focal deficits, moving all 4 extremities. Psychiatric:  Normal mood and affect.  Laboratory Data:  Lab Results  Component Value Date   CREATININE 0.78 03/01/2020     Urinalysis Results for orders placed or performed in visit on 03/04/21  Microscopic Examination   Urine  Result Value Ref Range   WBC, UA 0-5 0 - 5 /hpf   RBC 0-2 0 - 2 /hpf   Epithelial Cells (non renal) 0-10 0 - 10 /hpf   Bacteria, UA None seen None seen/Few  Urinalysis, Complete  Result Value Ref Range   Specific Gravity, UA 1.020 1.005 - 1.030   pH, UA 6.0 5.0 - 7.5   Color, UA Yellow Yellow   Appearance Ur Clear Clear   Leukocytes,UA Negative Negative   Protein,UA Negative Negative/Trace   Glucose, UA Negative Negative   Ketones, UA Negative Negative   RBC, UA Trace (A) Negative   Bilirubin, UA Negative Negative   Urobilinogen, Ur 0.2 0.2 - 1.0 mg/dL   Nitrite, UA Negative Negative   Microscopic Examination See below:   Bladder Scan (Post Void Residual) in office  Result Value Ref Range   Scan Result 0      Pertinent Imaging: CLINICAL DATA:  Left lower quadrant pain for 8 months.   EXAM: CT ABDOMEN AND PELVIS WITH CONTRAST   TECHNIQUE: Multidetector CT imaging of the abdomen and pelvis was performed using the standard protocol following bolus administration of intravenous contrast.   CONTRAST:  159mL OMNIPAQUE IOHEXOL 350 MG/ML SOLN   COMPARISON:  01/04/2020   FINDINGS: Lower Chest: No acute findings.   Hepatobiliary: Mild diffuse hepatic steatosis. Tiny cyst in the anterior liver dome is stable, as well as a tiny focus of arterial phase hyperenhancement in the lateral liver dome.  No new or enlarging liver lesions identified. Prior cholecystectomy. No evidence of biliary obstruction.   Pancreas:  No mass or inflammatory changes.   Spleen: Within normal limits in size and appearance.   Adrenals/Urinary Tract: No masses identified. Several small left renal cysts are again noted. No evidence of ureteral calculi or hydronephrosis.    Stomach/Bowel: Mild diverticulitis is seen involving the mid sigmoid colon. No evidence of bowel perforation or abscess.   Vascular/Lymphatic: No pathologically enlarged lymph nodes. No acute vascular findings. Aortic atherosclerotic calcification noted.   Reproductive:  Stable mildly enlarged prostate.   Other:  None.   Musculoskeletal:  No suspicious bone lesions identified.   IMPRESSION: Mild sigmoid diverticulitis.  No evidence of perforation or abscess.   Mild hepatic steatosis.   Stable mildly enlarged prostate.   Aortic Atherosclerosis (ICD10-I70.0).     Electronically Signed   By: Marlaine Hind M.D.   On: 12/22/2020 14:1   CT scan personally reviewed.  Agree with radiologic interpretation.  No underlying GU pathology other than prostatomegaly.  He does have mild diverticulitis with nothing to suggest colovesical fistula  Assessment & Plan:    BPH with outlet obstruction/ history of urinary retention -Resume Flomax ideally - Discussed different treatment options such as surgical intervention versus optimizing BPH medication. He is not interested as of right now.   2. Alcohol abuse  - He denies that this is an ongoing issue - recommend cut back on alcohol. He continues to drink 4 beers and 2 shots daily.   3. Recurrent UTIs  - Discussed different medicines , BPH medication versus supplements such as cranberry tablets, probiotics, and d-mannose. He is interested in supplements today.  - reviewed most recent imaging nothing sugest he has any underlying issues except for BPH  -If he has any signs or symptoms of infection, and like him to return to our clinic for reevaluation -He has recurrent infections, consider repeat imaging versus more aggressive treatment of his BPH which is likely the underlying issue  Follow-up in 1 year for IPSS, PVR  I,Kailey Littlejohn,acting as a scribe for Hollice Espy, MD.,have documented all relevant documentation on the behalf of  Hollice Espy, MD,as directed by  Hollice Espy, MD while in the presence of Hollice Espy, [place] [address], [place], [place] [address] [phone]

## 2021-03-04 ENCOUNTER — Encounter: Payer: Self-pay | Admitting: Urology

## 2021-03-04 ENCOUNTER — Ambulatory Visit: Payer: Medicare Other | Admitting: Urology

## 2021-03-04 ENCOUNTER — Other Ambulatory Visit: Payer: Self-pay

## 2021-03-04 VITALS — BP 123/79 | HR 79 | Ht 69.0 in | Wt 220.0 lb

## 2021-03-04 DIAGNOSIS — N390 Urinary tract infection, site not specified: Secondary | ICD-10-CM

## 2021-03-04 DIAGNOSIS — N401 Enlarged prostate with lower urinary tract symptoms: Secondary | ICD-10-CM | POA: Diagnosis not present

## 2021-03-04 DIAGNOSIS — Z87898 Personal history of other specified conditions: Secondary | ICD-10-CM | POA: Diagnosis not present

## 2021-03-04 DIAGNOSIS — N138 Other obstructive and reflux uropathy: Secondary | ICD-10-CM

## 2021-03-04 LAB — URINALYSIS, COMPLETE
Bilirubin, UA: NEGATIVE
Glucose, UA: NEGATIVE
Ketones, UA: NEGATIVE
Leukocytes,UA: NEGATIVE
Nitrite, UA: NEGATIVE
Protein,UA: NEGATIVE
Specific Gravity, UA: 1.02 (ref 1.005–1.030)
Urobilinogen, Ur: 0.2 mg/dL (ref 0.2–1.0)
pH, UA: 6 (ref 5.0–7.5)

## 2021-03-04 LAB — MICROSCOPIC EXAMINATION: Bacteria, UA: NONE SEEN

## 2021-03-04 LAB — BLADDER SCAN AMB NON-IMAGING: Scan Result: 0

## 2021-03-04 NOTE — Patient Instructions (Addendum)
Please start over the counter Cranberry tablets, D-Mannose, and probiotics to help aid in prevention of UTIs.

## 2021-06-10 ENCOUNTER — Encounter: Payer: Self-pay | Admitting: Internal Medicine

## 2021-06-11 ENCOUNTER — Ambulatory Visit: Payer: Medicare Other | Admitting: Anesthesiology

## 2021-06-11 ENCOUNTER — Ambulatory Visit
Admission: RE | Admit: 2021-06-11 | Discharge: 2021-06-11 | Disposition: A | Discharge status: Home / Self Care | Payer: Medicare Other | Attending: Internal Medicine | Admitting: Internal Medicine

## 2021-06-11 ENCOUNTER — Encounter: Payer: Self-pay | Admitting: Internal Medicine

## 2021-06-11 ENCOUNTER — Encounter
Admission: RE | Disposition: A | Discharge status: Home / Self Care | Payer: Self-pay | Source: Home / Self Care | Attending: Internal Medicine

## 2021-06-11 DIAGNOSIS — I509 Heart failure, unspecified: Secondary | ICD-10-CM | POA: Diagnosis not present

## 2021-06-11 DIAGNOSIS — Z95810 Presence of automatic (implantable) cardiac defibrillator: Secondary | ICD-10-CM | POA: Insufficient documentation

## 2021-06-11 DIAGNOSIS — Z85820 Personal history of malignant melanoma of skin: Secondary | ICD-10-CM | POA: Insufficient documentation

## 2021-06-11 DIAGNOSIS — Z8711 Personal history of peptic ulcer disease: Secondary | ICD-10-CM | POA: Insufficient documentation

## 2021-06-11 DIAGNOSIS — I110 Hypertensive heart disease with heart failure: Secondary | ICD-10-CM | POA: Insufficient documentation

## 2021-06-11 DIAGNOSIS — Z85048 Personal history of other malignant neoplasm of rectum, rectosigmoid junction, and anus: Secondary | ICD-10-CM | POA: Insufficient documentation

## 2021-06-11 DIAGNOSIS — Z1211 Encounter for screening for malignant neoplasm of colon: Secondary | ICD-10-CM | POA: Diagnosis present

## 2021-06-11 DIAGNOSIS — I4891 Unspecified atrial fibrillation: Secondary | ICD-10-CM | POA: Insufficient documentation

## 2021-06-11 DIAGNOSIS — K219 Gastro-esophageal reflux disease without esophagitis: Secondary | ICD-10-CM | POA: Insufficient documentation

## 2021-06-11 DIAGNOSIS — E669 Obesity, unspecified: Secondary | ICD-10-CM | POA: Diagnosis not present

## 2021-06-11 DIAGNOSIS — K5730 Diverticulosis of large intestine without perforation or abscess without bleeding: Secondary | ICD-10-CM | POA: Diagnosis not present

## 2021-06-11 DIAGNOSIS — N400 Enlarged prostate without lower urinary tract symptoms: Secondary | ICD-10-CM | POA: Diagnosis not present

## 2021-06-11 DIAGNOSIS — Z8614 Personal history of Methicillin resistant Staphylococcus aureus infection: Secondary | ICD-10-CM | POA: Diagnosis not present

## 2021-06-11 DIAGNOSIS — I429 Cardiomyopathy, unspecified: Secondary | ICD-10-CM | POA: Insufficient documentation

## 2021-06-11 DIAGNOSIS — R1310 Dysphagia, unspecified: Secondary | ICD-10-CM | POA: Diagnosis not present

## 2021-06-11 DIAGNOSIS — G4730 Sleep apnea, unspecified: Secondary | ICD-10-CM | POA: Insufficient documentation

## 2021-06-11 DIAGNOSIS — K640 First degree hemorrhoids: Secondary | ICD-10-CM | POA: Insufficient documentation

## 2021-06-11 DIAGNOSIS — D122 Benign neoplasm of ascending colon: Secondary | ICD-10-CM | POA: Insufficient documentation

## 2021-06-11 DIAGNOSIS — K5900 Constipation, unspecified: Secondary | ICD-10-CM | POA: Insufficient documentation

## 2021-06-11 DIAGNOSIS — Z6832 Body mass index (BMI) 32.0-32.9, adult: Secondary | ICD-10-CM | POA: Insufficient documentation

## 2021-06-11 DIAGNOSIS — K760 Fatty (change of) liver, not elsewhere classified: Secondary | ICD-10-CM | POA: Diagnosis not present

## 2021-06-11 DIAGNOSIS — Z8371 Family history of colonic polyps: Secondary | ICD-10-CM | POA: Insufficient documentation

## 2021-06-11 HISTORY — DX: Cyst of kidney, acquired: N28.1

## 2021-06-11 HISTORY — DX: Thrombocytopenia, unspecified: D69.6

## 2021-06-11 HISTORY — DX: Gout, unspecified: M10.9

## 2021-06-11 HISTORY — DX: Abdominal aortic aneurysm, without rupture, unspecified: I71.40

## 2021-06-11 HISTORY — DX: Calculus of gallbladder without cholecystitis without obstruction: K80.20

## 2021-06-11 HISTORY — PX: COLONOSCOPY WITH PROPOFOL: SHX5780

## 2021-06-11 HISTORY — DX: Duodenal ulcer, unspecified as acute or chronic, without hemorrhage or perforation: K26.9

## 2021-06-11 HISTORY — DX: Sciatica, unspecified side: M54.30

## 2021-06-11 HISTORY — DX: Fatty (change of) liver, not elsewhere classified: K76.0

## 2021-06-11 HISTORY — DX: Hyperlipidemia, unspecified: E78.5

## 2021-06-11 HISTORY — DX: Occlusion and stenosis of unspecified carotid artery: I65.29

## 2021-06-11 SURGERY — COLONOSCOPY WITH PROPOFOL
Anesthesia: General

## 2021-06-11 MED ORDER — SODIUM CHLORIDE 0.9 % IV SOLN: INTRAVENOUS | Status: DC

## 2021-06-11 MED ORDER — LIDOCAINE HCL (CARDIAC) PF 100 MG/5ML IV SOSY
PREFILLED_SYRINGE | INTRAVENOUS | Status: DC | PRN
  Administered 2021-06-11: 100 mg via INTRAVENOUS

## 2021-06-11 MED ORDER — PROPOFOL 500 MG/50ML IV EMUL
INTRAVENOUS | Status: DC | PRN
  Administered 2021-06-11: 100 ug/kg/min via INTRAVENOUS

## 2021-06-11 MED ORDER — PROPOFOL 10 MG/ML IV BOLUS
INTRAVENOUS | Status: DC | PRN
Start: 2021-06-11 — End: 2021-06-11
  Administered 2021-06-11: 70 mg via INTRAVENOUS

## 2021-06-11 NOTE — Anesthesia Postprocedure Evaluation (Signed)
Anesthesia Post Note  Patient: Martin Hardy.  Procedure(s) Performed: COLONOSCOPY WITH PROPOFOL  Patient location during evaluation: PACU Anesthesia Type: General Level of consciousness: awake and alert, oriented and patient cooperative Pain management: pain level controlled Vital Signs Assessment: post-procedure vital signs reviewed and stable Respiratory status: spontaneous breathing, nonlabored ventilation and respiratory function stable Cardiovascular status: blood pressure returned to baseline and stable Postop Assessment: adequate PO intake Anesthetic complications: no   No notable events documented.   Last Vitals:  Vitals:   06/11/21 1533 06/11/21 1543  BP: 120/73 131/75  Pulse: (!) 58 (!) 53  Resp: 15 14  Temp:  (!) 36.3 C  SpO2: 94% 96%    Last Pain:  Vitals:   06/11/21 1543  TempSrc: Temporal  PainSc: 0-No pain                 Darrin Nipper

## 2021-06-11 NOTE — Interval H&P Note (Signed)
History and Physical Interval Note:  06/11/2021 3:00 PM  Martin Hardy.  has presented today for surgery, with the diagnosis of Personal history of adenomatous polyps  Carcinoid tumor of the rectum, unsp. whether malignant.  The various methods of treatment have been discussed with the patient and family. After consideration of risks, benefits and other options for treatment, the patient has consented to  Procedure(s): COLONOSCOPY WITH PROPOFOL (N/A) as a surgical intervention.  The patient's history has been reviewed, patient examined, no change in status, stable for surgery.  I have reviewed the patient's chart and labs.  Questions were answered to the patient's satisfaction.     [place], [place]

## 2021-06-11 NOTE — H&P (Signed)
Outpatient short stay form Pre-procedure 06/11/2021 2:56 PM Martin Hardy K. Alice Reichert, M.D.  Primary Physician: Adrian Prows, M.D.  Reason for visit:   Personal history of colon polyps  History of present illness:  02/24/05: CC: rectal bleeding. Reports some blood after a BM. No prior CSY. Occasional dysphagia x 3-4 years, unchanged. Occasionally induced vomiting to clear bolus. Also occasional heartburn. Report h/o PUD. Plan: CSY and EGD. - 02/2005: CSY - diverticulosis, small polyp in rectum (hyperplastic); repeat in 2011 due to FHx colon polyps (father). - 03/14/07: CC: Constipation x 4-5 weeks. Stools evert 3-4 days. Assoc/d with lower abd bloating and discomfort. Has to take laxative to have BM. Unsure if drinking enough fluids. Plan: Begin fiber supplement, Amitiza 50mcg BID. - 06/12/08: F/up admission last month for upper GI bleed. H/o duodenal ulcers 18 years ago. Taking Aleve for back pain. Developed acute onset n/v, hematemesis w/ CGE, and melena. Hgb 12.3 > 11.5. EGD demonstrated bleeding ulcer in distal bulb near junction, second portion of duodenum w/ clear visible vessel in base of ulcer; clip unable to be placed, epinephrine injected but caused increased bleeding, gold probe applied x 3. Dr. Rochel Brome evaluated, recommend emergent vagotomy, pyloroplasty, report of duodenal stricture, and suture ligation of gastroduodenal artery. Taking Pantoprazole, avoiding NSAIDs. Occasional belching after eating. - 05/22/10: F/up duodenal ulcer s/p vagotomy, FHx of colon polyps. No current GI sx. New dx of dilated cardiomyopathy (s/p AICD pacemaker placed 08/09/09) and macular degeneration. - 26/12: CSY - one 12mm polyp in descending colon (TA), one 27mm polyp in sigmoid colon (TA), one 36mm polyp in rectum (Grade I NET), sigmoid/descending diverticulosis, internal hemorrhoids; repeat in 06/2011 recommended - 09/17/11: F/up rectal carcinoid tumor. No GI concerns. New sleep apnea and PVCs. - 10/19/11: CSY - one  diminutive polyp in sigmoid colon (hyperplastic), one 48mm polyp in rectum (hyperplastic), sigmoid/desceding diverticulosis, internal hemorrhoids; repeat in 10/2016 recommended. 07/22/2016 - RTE - Pancolonic diverticulosis, internal hemorrhoids.  Interval History   Martin Hardy presents today for follow-up of the above issues including history of alcoholic fatty liver, colon polyps, rectal neuroendocrine tumor stage I and chronic constipation. Patient was seen in August 2022 for acute onset left lower quadrant pain diagnosed with diverticulitis by CT of the abdomen and pelvis. Patient was treated reportedly by Dr. Ola Spurr and improved afterwards but continued to have issues with persistent low-grade abdominal discomfort which is worse at night. He has been started on stool softeners which is improved the pain but has not relieved it completely. The patient says the pain is worse is on his left side and does not radiate can be anywhere from a severity of 2-8, worse at night. Pain relieved somewhat by rolling onto his right side. Previous colonoscopy results stated above suggested pancolonic diverticulosis on his last colonoscopy March 2018. He is due for colon polyp surveillance. He takes Eliquis for history of atrial fibrillation noted after assessment and change of batteries of his AICD several months ago. He denies chest pain or shortness of breath at this time. He reportedly attempted to stop drinking alcohol, according to Dr. Ola Spurr, but resumed after only being off alcohol for few days. There appears to be some mild depression involved with this. 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     Current Facility-Administered Medications:    0.9 %  sodium chloride infusion, , Intravenous, Continuous, Ashok Sawaya, Benay Pike, MD  Medications Prior to Admission  Medication Sig Dispense Refill Last Dose   amLODipine (NORVASC) 5 MG  tablet Take 10 mg by mouth daily.   06/11/2021 at 0630    apixaban (ELIQUIS) 5 MG TABS tablet Take by mouth every 12 (twelve) hours.   Past Week   carvedilol (COREG) 25 MG tablet Take 25 mg by mouth 2 (two) times daily.    06/11/2021 at 0630   colchicine 0.6 MG tablet Take 0.6 mg by mouth as needed.   Past Month   fluticasone (FLONASE) 50 MCG/ACT nasal spray Place 2 sprays into both nostrils daily.   06/11/2021   Fluticasone Propionate, Inhal, (FLOVENT DISKUS) 100 MCG/ACT AEPB Inhale into the lungs 2 (two) times daily.      hydrochlorothiazide (HYDRODIURIL) 25 MG tablet Take 25 mg by mouth daily.   06/11/2021   lisinopril (PRINIVIL,ZESTRIL) 40 MG tablet Take 40 mg by mouth daily.    06/11/2021   omeprazole (PRILOSEC) 20 MG capsule Take 20 mg by mouth 2 (two) times daily.    06/11/2021   oxymetazoline (AFRIN) 0.05 % nasal spray Place 1 spray into both nostrils daily as needed for congestion.   Past Week   potassium chloride SA (K-DUR,KLOR-CON) 20 MEQ tablet Take 20 mEq by mouth every other day.    Past Week   pregabalin (LYRICA) 150 MG capsule Take 150 mg by mouth at bedtime.    06/11/2021   Psyllium (METAMUCIL PO) Take by mouth daily.   Past Week   tamsulosin (FLOMAX) 0.4 MG CAPS capsule Take 0.4 mg by mouth daily.   06/11/2021   traZODone (DESYREL) 50 MG tablet Take 50 mg by mouth at bedtime.   06/10/2021   venlafaxine XR (EFFEXOR-XR) 75 MG 24 hr capsule Take 75 mg by mouth daily.   06/11/2021   aspirin EC 81 MG tablet Take 81 mg by mouth daily.         No Known Allergies   Past Medical History:  Diagnosis Date   Abdominal aortic aneurysm (AAA)    AICD (automatic cardioverter/defibrillator) present    Alcohol abuse    Bladder tumor    BPH (benign prostatic hyperplasia)    Cancer ([place]) 2012   Polyp resected colonoscopy   Carcinoma of rectum ([place]) 2012   Cardiomyopathy ([place])    Carotid artery stenosis    CHF (congestive heart failure) ([place])    CHRONIC   Cyst of left kidney    Depression    Diverticulosis    Duodenal ulcer    Dysrhythmia    Enlarged  heart    Enlarged heart    Epididymal mass    Fatty liver    Gallstones    GERD (gastroesophageal reflux disease)    Gout    History of bleeding peptic ulcer    History of blood transfusion    Hx MRSA infection    Hyperlipidemia    Hypertension    Obesity    Overweight    Presence of permanent cardiac pacemaker    PUD (peptic ulcer disease)    Sciatica neuralgia, left    Scrotal cyst    Skin cancer    basal cell, shoulders chest and arms    Skin cancer 2013   melanoma on chest   Sleep apnea    cpap   Spermatocele    Syncope 2014   Thrombocytopenia ([place])    Urinary retention     Review of systems:  Otherwise negative.    Physical Exam  Gen: Alert, oriented. Appears stated age.  HEENT: [place]/AT. PERRLA. Lungs: CTA, no wheezes. CV: RR nl S1,  S2. Abd: soft, benign, no masses. BS+ Ext: No edema. Pulses 2+    Planned procedures: Proceed with colonoscopy. The patient understands the nature of the planned procedure, indications, risks, alternatives and potential complications including but not limited to bleeding, infection, perforation, damage to internal organs and possible oversedation/side effects from anesthesia. The patient agrees and gives consent to proceed.  Please refer to procedure notes for findings, recommendations and patient disposition/instructions.     Jilene Spohr K. Alice Reichert, M.D. Gastroenterology 06/11/2021  2:56 PM

## 2021-06-11 NOTE — Op Note (Signed)
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 Providers:             Benay Pike. Alice Reichert MD, MD Referring MD:          Adrian Prows (Referring MD) Medicines:             Propofol per Anesthesia Complications:         No immediate complications. Procedure:             Pre-Anesthesia Assessment:                        - The risks and benefits of the procedure and the                         sedation options and risks were discussed with the                         patient. All questions were answered and informed                         consent was obtained.                        - Patient identification and proposed procedure were                         verified prior to the procedure by the nurse. The                         procedure was verified in the procedure room.                        - ASA Grade Assessment: III - A patient with severe                         systemic disease.                        - After reviewing the risks and benefits, the patient                         was deemed in satisfactory condition to undergo the  procedure.                        After obtaining informed consent, the colonoscope was                         passed under direct vision. Throughout the procedure,                         the patient's blood pressure, pulse, and oxygen                         saturations were monitored  continuously. The                         Colonoscope was introduced through the anus and                         advanced to the the cecum, identified by appendiceal                         orifice and ileocecal valve. The colonoscopy was                         performed without difficulty. The patient tolerated                         the procedure well. The quality of the bowel                         preparation was good. The ileocecal valve, appendiceal                         orifice, and rectum were photographed. Findings:      The perianal and digital rectal examinations were normal. Pertinent       negatives include normal sphincter tone and no palpable rectal lesions.      Non-bleeding internal hemorrhoids were found during retroflexion. The       hemorrhoids were Grade I (internal hemorrhoids that do not prolapse).      Many small and large-mouthed diverticula were found in the sigmoid       colon. There was no evidence of diverticular bleeding.      A 5 mm polyp was found in the ascending colon. The polyp was sessile.       The polyp was removed with a jumbo cold forceps. Resection and retrieval       were complete.      The exam was otherwise without abnormality. Impression:            - Non-bleeding internal hemorrhoids.                        - Mild diverticulosis in the sigmoid colon. There was                         no evidence of diverticular bleeding.                        - One 5 mm polyp in the ascending colon, removed with  a jumbo cold forceps. Resected and retrieved.                        - The examination was otherwise normal. Recommendation:        - Patient has a contact number available for                         emergencies. The signs and symptoms of potential                         delayed complications were discussed with the patient.                         Return to normal activities tomorrow. Written                          discharge instructions were provided to the patient.                        - Resume previous diet.                        - Continue present medications.                        - If polyps are benign or adenomatous without                         dysplasia, I will advise NO further colonoscopy due to                         advanced age and/or severe comorbidity.                        - Return to GI clinic PRN.                        - The findings and recommendations were discussed with                         the patient.                        - Resume Eliquis (apixaban) at prior dose tomorrow.                         Refer to managing physician for further adjustment of                         therapy. Procedure Code(s):     --- Professional ---                        [phone], Colonoscopy, flexible; with biopsy, single or                         multiple Diagnosis Code(s):     --- Professional ---                        K57.30, Diverticulosis of large intestine without  perforation or abscess without bleeding                        K63.5, Polyp of colon                        K64.0, First degree hemorrhoids                        Z86.010, Personal history of colonic polyps CPT copyright 2019 [employer]. All rights reserved. The codes documented in this report are preliminary and upon coder review may  be revised to meet current compliance requirements. Efrain Sella MD, MD 06/11/2021 3:33:26 PM This report has been signed electronically. Number of Addenda: 0 Note Initiated On: 06/11/2021 2:54 PM Scope Withdrawal Time: 0 hours 8 minutes 2 seconds  Total Procedure Duration: 0 hours 15 minutes 2 seconds  Estimated Blood Loss:  Estimated blood loss: none.      [facility]

## 2021-06-11 NOTE — Transfer of Care (Signed)
Immediate Anesthesia Transfer of Care Note  Patient: Martin Hardy.  Procedure(s) Performed: COLONOSCOPY WITH PROPOFOL  Patient Location: PACU and Endoscopy Unit  Anesthesia Type:General  Level of Consciousness: awake, drowsy and patient cooperative  Airway & Oxygen Therapy: Patient Spontanous Breathing  Post-op Assessment: Report given to RN, Post -op Vital signs reviewed and stable and Patient moving all extremities  Post vital signs: Reviewed and stable  Last Vitals:  Vitals Value Taken Time  BP 120/73 06/11/21 1533  Temp    Pulse 56 06/11/21 1533  Resp 14 06/11/21 1533  SpO2 95 % 06/11/21 1533  Vitals shown include unvalidated device data.  Last Pain:  Vitals:   06/11/21 1533  TempSrc:   PainSc: Asleep      Patients Stated Pain Goal: 0 (68/03/21 2248)  Complications: No notable events documented.

## 2021-06-11 NOTE — Anesthesia Preprocedure Evaluation (Signed)
Anesthesia Evaluation  Patient identified by MRN, date of birth, ID band Patient awake    Reviewed: Allergy & Precautions, NPO status , Patient's Chart, lab work & pertinent test results  History of Anesthesia Complications Negative for: history of anesthetic complications  Airway Mallampati: III  TM Distance: <3 FB Neck ROM: full    Dental  (+) Chipped   Pulmonary neg shortness of breath, sleep apnea ,    Pulmonary exam normal        Cardiovascular hypertension, (-) angina+CHF  Normal cardiovascular exam+ dysrhythmias Atrial Fibrillation + pacemaker + Cardiac Defibrillator      Neuro/Psych PSYCHIATRIC DISORDERS  Neuromuscular disease    GI/Hepatic Neg liver ROS, PUD, GERD  Controlled,  Endo/Other  negative endocrine ROS  Renal/GU Renal disease  negative genitourinary   Musculoskeletal  (+) Arthritis ,   Abdominal   Peds  Hematology negative hematology ROS (+)   Anesthesia Other Findings Past Medical History: No date: Abdominal aortic aneurysm (AAA) No date: AICD (automatic cardioverter/defibrillator) present No date: Alcohol abuse No date: Bladder tumor No date: BPH (benign prostatic hyperplasia) 2012: Cancer ([place])     Comment:  Polyp resected colonoscopy 2012: Carcinoma of rectum (HCC) No date: Cardiomyopathy ([place]) No date: Carotid artery stenosis No date: CHF (congestive heart failure) (HCC)     Comment:  CHRONIC No date: Cyst of left kidney No date: Depression No date: Diverticulosis No date: Duodenal ulcer No date: Dysrhythmia No date: Enlarged heart No date: Enlarged heart No date: Epididymal mass No date: Fatty liver No date: Gallstones No date: GERD (gastroesophageal reflux disease) No date: Gout No date: History of bleeding peptic ulcer No date: History of blood transfusion No date: Hx MRSA infection No date: Hyperlipidemia No date: Hypertension No date: Obesity No date:  Overweight No date: Presence of permanent cardiac pacemaker No date: PUD (peptic ulcer disease) No date: Sciatica neuralgia, left No date: Scrotal cyst No date: Skin cancer     Comment:  basal cell, shoulders chest and arms  2013: Skin cancer     Comment:  melanoma on chest No date: Sleep apnea     Comment:  cpap No date: Spermatocele 2014: Syncope No date: Thrombocytopenia ([place]) No date: Urinary retention  Past Surgical History: No date: CARDIAC CATHETERIZATION No date: CATARACT EXTRACTION, BILATERAL 06/08/2017: CHOLECYSTECTOMY; N/A     Comment:  Procedure: LAPAROSCOPIC CHOLECYSTECTOMY;  Surgeon:               Leonie Green, MD;  Location: [facility] ORS;  Service:               General;  Laterality: N/A; 07/22/2016: COLONOSCOPY WITH PROPOFOL; N/A     Comment:  Procedure: COLONOSCOPY WITH PROPOFOL;  Surgeon: Manya Silvas, MD;  Location: [facility] ENDOSCOPY;  Service:               Endoscopy;  Laterality: N/A; No date: EP IMPLANTABLE DEVICE 2014: EYE SURGERY; Bilateral     Comment:  cataract No date: FOOT NEUROMA SURGERY No date: gastric ulcer 09/24/2014: GREEN LIGHT LASER TURP (TRANSURETHRAL RESECTION OF  PROSTATE; N/A     Comment:  Procedure: GREEN LIGHT LASER TURP (TRANSURETHRAL               RESECTION OF PROSTATE;  Surgeon: Collier Flowers, MD;                Location: [facility] ORS;  Service:  Urology;  Laterality: N/A; No date: HERNIA REPAIR 12/03/2016: IMPLANTABLE CARDIOVERTER DEFIBRILLATOR (ICD) GENERATOR  CHANGE; N/A     Comment:  Procedure: BIV ICD GENERATOR CHANGE;  Surgeon:               [name], MD;  Location: [facility] ORS;  Service:               Cardiovascular;  Laterality: N/A;  ICD  generator               discarded per protocall No date: INSERT / REPLACE / REMOVE PACEMAKER 06/13/2015: SHOULDER ARTHROSCOPY WITH OPEN ROTATOR CUFF REPAIR; Left     Comment:  Procedure: SHOULDER ARTHROSCOPIC debridement and               decompression and  repair of rotator cuff tear;  Surgeon:               Corky Mull, MD;  Location: [facility] ORS;  Service:               Orthopedics;  Laterality: Left; 01/09/2016: SHOULDER ARTHROSCOPY WITH OPEN ROTATOR CUFF REPAIR; Left     Comment:  Procedure: SHOULDER ARTHROSCOPY, DEBRIDEMENT AND WITH               OPEN ROTATOR CUFF REPAIR;  Surgeon: Corky Mull, MD;                Location: [facility] ORS;  Service: Orthopedics;  Laterality:               Left; 05/13/2016: TRIGGER FINGER RELEASE; Right     Comment:  Procedure: RELEASE TRIGGER FINGER right ring finger;                Surgeon: Corky Mull, MD;  Location: [place];  Service: Orthopedics;  Laterality: Right;  sleep               apnea No date: VAGOTOMY  BMI    Body Mass Index: 32.64 kg/m      Reproductive/Obstetrics negative OB ROS                             Anesthesia Physical Anesthesia Plan  ASA: 3  Anesthesia Plan: General   Post-op Pain Management:    Induction: Intravenous  PONV Risk Score and Plan: Propofol infusion and TIVA  Airway Management Planned: Natural Airway and Nasal Cannula  Additional Equipment:   Intra-op Plan:   Post-operative Plan:   Informed Consent: I have reviewed the patients History and Physical, chart, labs and discussed the procedure including the risks, benefits and alternatives for the proposed anesthesia with the patient or authorized representative who has indicated his/her understanding and acceptance.     Dental Advisory Given  Plan Discussed with: Anesthesiologist, CRNA and Surgeon  Anesthesia Plan Comments: (Patient consented for risks of anesthesia including but not limited to:  - adverse reactions to medications - risk of airway placement if required - damage to eyes, teeth, lips or other oral mucosa - nerve damage due to positioning  - sore throat or hoarseness - Damage to heart, brain, nerves, lungs, other parts of body or loss  of life  Patient voiced understanding.)        Anesthesia Quick Evaluation

## 2021-06-12 ENCOUNTER — Encounter: Payer: Self-pay | Admitting: Internal Medicine

## 2021-06-13 LAB — SURGICAL PATHOLOGY

## 2022-03-04 ENCOUNTER — Encounter: Payer: Self-pay | Admitting: Urology

## 2022-03-04 ENCOUNTER — Ambulatory Visit (INDEPENDENT_AMBULATORY_CARE_PROVIDER_SITE_OTHER): Payer: Medicare Other | Admitting: Urology

## 2022-03-04 VITALS — BP 120/76 | HR 86 | Ht 69.0 in | Wt 231.8 lb

## 2022-03-04 DIAGNOSIS — N401 Enlarged prostate with lower urinary tract symptoms: Secondary | ICD-10-CM

## 2022-03-04 DIAGNOSIS — N138 Other obstructive and reflux uropathy: Secondary | ICD-10-CM

## 2022-03-04 LAB — BLADDER SCAN AMB NON-IMAGING

## 2022-03-04 NOTE — Progress Notes (Unsigned)
03/04/2022 1:05 PM   Martin Hardy 03-30-1946 [id number]  Referring provider: [name], MD [place],  [place] [id number]  No chief complaint on file.   HPI: 76 year old male who returns today for routine annual follow-up.  He has a personal history of urinary retention, BPH, epididymitis.  He underwent a greenlight procedure in 2016.  Previous notes for details.   PMH: Past Medical History:  Diagnosis Date   Abdominal aortic aneurysm (AAA)    AICD (automatic cardioverter/defibrillator) present    Alcohol abuse    Bladder tumor    BPH (benign prostatic hyperplasia)    Cancer ([place]) 2012   Polyp resected colonoscopy   Carcinoma of rectum ([place]) 2012   Cardiomyopathy ([place])    Carotid artery stenosis    CHF (congestive heart failure) ([place])    CHRONIC   Cyst of left kidney    Depression    Diverticulosis    Duodenal ulcer    Dysrhythmia    Enlarged heart    Enlarged heart    Epididymal mass    Fatty liver    Gallstones    GERD (gastroesophageal reflux disease)    Gout    History of bleeding peptic ulcer    History of blood transfusion    Hx MRSA infection    Hyperlipidemia    Hypertension    Obesity    Overweight    Presence of permanent cardiac pacemaker    PUD (peptic ulcer disease)    Sciatica neuralgia, left    Scrotal cyst    Skin cancer    basal cell, shoulders chest and arms    Skin cancer 2013   melanoma on chest   Sleep apnea    cpap   Spermatocele    Syncope 2014   Thrombocytopenia ([place])    Urinary retention     Surgical History: Past Surgical History:  Procedure Laterality Date   CARDIAC CATHETERIZATION     CATARACT EXTRACTION, BILATERAL     CHOLECYSTECTOMY N/A 06/08/2017   Procedure: LAPAROSCOPIC CHOLECYSTECTOMY;  Surgeon: Leonie Green, MD;  Location: [facility] ORS;  Service: General;  Laterality: N/A;   COLONOSCOPY WITH PROPOFOL N/A 07/22/2016   Procedure: COLONOSCOPY WITH PROPOFOL;  Surgeon:  Manya Silvas, MD;  Location: [facility] ENDOSCOPY;  Service: Endoscopy;  Laterality: N/A;   COLONOSCOPY WITH PROPOFOL N/A 06/11/2021   Procedure: COLONOSCOPY WITH PROPOFOL;  Surgeon: [place], Benay Pike, MD;  Location: [facility] ENDOSCOPY;  Service: Gastroenterology;  Laterality: N/A;   EP IMPLANTABLE DEVICE     EYE SURGERY Bilateral 2014   cataract   FOOT NEUROMA SURGERY     gastric ulcer     GREEN LIGHT LASER TURP (TRANSURETHRAL RESECTION OF PROSTATE N/A 09/24/2014   Procedure: GREEN LIGHT LASER TURP (TRANSURETHRAL RESECTION OF PROSTATE;  Surgeon: Collier Flowers, MD;  Location: [facility] ORS;  Service: Urology;  Laterality: N/A;   HERNIA REPAIR     IMPLANTABLE CARDIOVERTER DEFIBRILLATOR (ICD) GENERATOR CHANGE N/A 12/03/2016   Procedure: BIV ICD GENERATOR CHANGE;  Surgeon: [name], MD;  Location: [facility] ORS;  Service: Cardiovascular;  Laterality: N/A;  ICD  generator discarded per protocall   INSERT / REPLACE / REMOVE PACEMAKER     SHOULDER ARTHROSCOPY WITH OPEN ROTATOR CUFF REPAIR Left 06/13/2015   Procedure: SHOULDER ARTHROSCOPIC debridement and decompression and repair of rotator cuff tear;  Surgeon: Corky Mull, MD;  Location: [facility] ORS;  Service: Orthopedics;  Laterality: Left;   SHOULDER ARTHROSCOPY WITH OPEN  ROTATOR CUFF REPAIR Left 01/09/2016   Procedure: SHOULDER ARTHROSCOPY, DEBRIDEMENT AND WITH OPEN ROTATOR CUFF REPAIR;  Surgeon: Corky Mull, MD;  Location: [facility] ORS;  Service: Orthopedics;  Laterality: Left;   TRIGGER FINGER RELEASE Right 05/13/2016   Procedure: RELEASE TRIGGER FINGER right ring finger;  Surgeon: Corky Mull, MD;  Location: [facility];  Service: Orthopedics;  Laterality: Right;  sleep apnea   VAGOTOMY      Home Medications:  Allergies as of 03/04/2022   No Known Allergies      Medication List        Accurate as of March 04, 2022  1:05 PM. If you have any questions, ask your nurse or doctor.          amLODipine 5 MG tablet Commonly known as:  NORVASC Take 10 mg by mouth daily.   apixaban 5 MG Tabs tablet Commonly known as: ELIQUIS Take by mouth every 12 (twelve) hours.   aspirin EC 81 MG tablet Take 81 mg by mouth daily.   carvedilol 25 MG tablet Commonly known as: COREG Take 25 mg by mouth 2 (two) times daily.   colchicine 0.6 MG tablet Take 0.6 mg by mouth as needed.   Flovent Diskus 100 MCG/ACT Aepb Generic drug: Fluticasone Propionate (Inhal) Inhale into the lungs 2 (two) times daily.   fluticasone 50 MCG/ACT nasal spray Commonly known as: FLONASE Place 2 sprays into both nostrils daily.   hydrochlorothiazide 25 MG tablet Commonly known as: HYDRODIURIL Take 25 mg by mouth daily.   lisinopril 40 MG tablet Commonly known as: ZESTRIL Take 40 mg by mouth daily.   METAMUCIL PO Take by mouth daily.   omeprazole 20 MG capsule Commonly known as: PRILOSEC Take 20 mg by mouth 2 (two) times daily.   oxymetazoline 0.05 % nasal spray Commonly known as: AFRIN Place 1 spray into both nostrils daily as needed for congestion.   potassium chloride SA 20 MEQ tablet Commonly known as: KLOR-CON M Take 20 mEq by mouth every other day.   pregabalin 150 MG capsule Commonly known as: LYRICA Take 150 mg by mouth at bedtime.   tamsulosin 0.4 MG Caps capsule Commonly known as: FLOMAX Take 0.4 mg by mouth daily.   traZODone 50 MG tablet Commonly known as: DESYREL Take 50 mg by mouth at bedtime.   venlafaxine XR 75 MG 24 hr capsule Commonly known as: EFFEXOR-XR Take 75 mg by mouth daily.        Allergies: No Known Allergies  Family History: Family History  Problem Relation Age of Onset   COPD Mother    Hypertension Mother        Father   Hypertension Father    Cancer Father    Colon polyps Father    Prostate cancer Neg Hx    Kidney cancer Neg Hx    Bladder Cancer Neg Hx     Social History:  reports that he has never smoked. He has quit using smokeless tobacco.  His smokeless tobacco use included  snuff. He reports current alcohol use of about 39.0 standard drinks of alcohol per week. He reports that he does not use drugs.   Physical Exam: There were no vitals taken for this visit.  Constitutional:  Alert and oriented, No acute distress. HEENT: [place] AT, moist mucus membranes.  Trachea midline, no masses. Cardiovascular: No clubbing, cyanosis, or edema. Respiratory: Normal respiratory effort, no increased work of breathing. GI: Abdomen is soft, nontender, nondistended, no abdominal masses GU:  No CVA tenderness Skin: No rashes, bruises or suspicious lesions. Neurologic: Grossly intact, no focal deficits, moving all 4 extremities. Psychiatric: Normal mood and affect.  Laboratory Data: Lab Results  Component Value Date   WBC 5.6 03/01/2020   HGB 15.3 03/01/2020   HCT 45.2 03/01/2020   MCV 87.6 03/01/2020   PLT 143 (L) 03/01/2020    Lab Results  Component Value Date   CREATININE 0.78 03/01/2020    No results found for: "PSA"  No results found for: "TESTOSTERONE"  No results found for: "HGBA1C"  Urinalysis    Component Value Date/Time   COLORURINE YELLOW (A) 01/05/2017 0836   APPEARANCEUR Clear 03/04/2021 1417   LABSPEC 1.013 01/05/2017 0836   LABSPEC 1.003 06/01/2014 1944   PHURINE 6.0 01/05/2017 0836   GLUCOSEU Negative 03/04/2021 1417   GLUCOSEU Negative 06/01/2014 1944   HGBUR NEGATIVE 01/05/2017 0836   BILIRUBINUR Negative 03/04/2021 1417   BILIRUBINUR Negative 06/01/2014 1944   KETONESUR NEGATIVE 01/05/2017 0836   PROTEINUR Negative 03/04/2021 1417   PROTEINUR NEGATIVE 01/05/2017 0836   NITRITE Negative 03/04/2021 1417   NITRITE NEGATIVE 01/05/2017 0836   LEUKOCYTESUR Negative 03/04/2021 1417   LEUKOCYTESUR Negative 06/01/2014 1944    Lab Results  Component Value Date   LABMICR See below: 03/04/2021   WBCUA 0-5 03/04/2021   RBCUA None seen 01/26/2017   LABEPIT 0-10 03/04/2021   MUCUS Present (A) 10/02/2016   BACTERIA None seen 03/04/2021     Pertinent Imaging: *** No results found for this or any previous visit.  No results found for this or any previous visit.  No results found for this or any previous visit.  No results found for this or any previous visit.  No results found for this or any previous visit.  No valid procedures specified. No results found for this or any previous visit.  No results found for this or any previous visit.   Assessment & Plan:    There are no diagnoses linked to this encounter.  No follow-ups on file.  Hollice Espy, MD  [facility] [address], [place], [place] [address] [phone]

## 2022-03-17 ENCOUNTER — Encounter: Payer: Self-pay | Admitting: Urology

## 2022-03-17 ENCOUNTER — Ambulatory Visit: Payer: Medicare Other | Admitting: Urology

## 2022-04-06 ENCOUNTER — Ambulatory Visit (INDEPENDENT_AMBULATORY_CARE_PROVIDER_SITE_OTHER): Payer: Medicare Other | Admitting: Physician Assistant

## 2022-04-06 ENCOUNTER — Encounter: Payer: Self-pay | Admitting: Physician Assistant

## 2022-04-06 VITALS — BP 126/73 | HR 65 | Ht 69.0 in | Wt 231.0 lb

## 2022-04-06 DIAGNOSIS — R300 Dysuria: Secondary | ICD-10-CM

## 2022-04-06 DIAGNOSIS — R32 Unspecified urinary incontinence: Secondary | ICD-10-CM

## 2022-04-06 LAB — URINALYSIS, COMPLETE
Bilirubin, UA: NEGATIVE
Glucose, UA: NEGATIVE
Ketones, UA: NEGATIVE
Nitrite, UA: NEGATIVE
Specific Gravity, UA: 1.02 (ref 1.005–1.030)
Urobilinogen, Ur: 4 mg/dL — ABNORMAL HIGH (ref 0.2–1.0)
pH, UA: 6 (ref 5.0–7.5)

## 2022-04-06 LAB — MICROSCOPIC EXAMINATION: WBC, UA: >30 /hpf — AB (ref 0–5)

## 2022-04-06 LAB — BLADDER SCAN AMB NON-IMAGING: Scan Result: 17

## 2022-04-06 MED ORDER — SULFAMETHOXAZOLE-TRIMETHOPRIM 800-160 MG PO TABS: 1.0000 | ORAL_TABLET | Freq: Two times a day (BID) | ORAL | 0 refills | Status: AC

## 2022-04-06 NOTE — Progress Notes (Unsigned)
04/06/2022 10:50 AM   Martin Hardy 06/01/1945 [id number]  CC: Chief Complaint  Patient presents with   Urinary Incontinence    HPI: Martin Hardy. is a 76 y.o. male with PMH urinary retention previously managed with CIC, BPH s/p greenlight in 2016, and recurrent epididymitis who presents today for evaluation of possible UTI.   Today he reports his PCP treated him for UTI about 2 weeks ago with amoxicillin.  His symptoms largely resolved, but they returned 2 days ago.  He started taking amoxicillin that he had left over at home again yesterday.  Per chart review, it looks like his recent UTI was actually on 03/12/2022.  Urine culture grew ampicillin and tetracycline resistant E. coli and he was treated with Bactrim DS twice daily x 10 days.  I do not see any record of recent amoxicillin in his chart.   He denies testicular pain or swelling, penile discharge, fever, chills, nausea, vomiting, flank pain, and gross hematuria.  He denies any recent anal sex.  He reports it has been about 1 year since his last UTI.  In-office UA today positive for 1+ blood, 2+ protein, 4.0 EU/DL urobilinogen, and 1+ leukocytes; urine microscopy with >30 WBCs/HPF, 11-30 RBCs/HPF, and moderate bacteria. PVR 29m.  PMH: Past Medical History:  Diagnosis Date   Abdominal aortic aneurysm (AAA) ([place]    AICD (automatic cardioverter/defibrillator) present    Alcohol abuse    Bladder tumor    BPH (benign prostatic hyperplasia)    [place]   Polyp resected colonoscopy   Carcinoma of rectum ([place] 2012   Cardiomyopathy ([place]    Carotid artery stenosis    CHF (congestive heart failure) ([place]    CHRONIC   Cyst of left kidney    Depression    Diverticulosis    Duodenal ulcer    Dysrhythmia    Enlarged heart    Enlarged heart    Epididymal mass    Fatty liver    Gallstones    GERD (gastroesophageal reflux disease)    Gout    History of bleeding peptic ulcer    History of blood  transfusion    Hx MRSA infection    Hyperlipidemia    Hypertension    Obesity    Overweight    Presence of permanent cardiac pacemaker    PUD (peptic ulcer disease)    Sciatica neuralgia, left    Scrotal cyst    Skin cancer    basal cell, shoulders chest and arms    Skin cancer 2013   melanoma on chest   Sleep apnea    cpap   Spermatocele    Syncope 2014   Thrombocytopenia ([place]    Urinary retention     Surgical History: Past Surgical History:  Procedure Laterality Date   CARDIAC CATHETERIZATION     CATARACT EXTRACTION, BILATERAL     CHOLECYSTECTOMY N/A 06/08/2017   Procedure: LAPAROSCOPIC CHOLECYSTECTOMY;  Surgeon: SLeonie Green MD;  Location: [facility];  Service: General;  Laterality: N/A;   COLONOSCOPY WITH PROPOFOL N/A 07/22/2016   Procedure: COLONOSCOPY WITH PROPOFOL;  Surgeon: RManya Silvas MD;  [facility];  Service: Endoscopy;  Laterality: N/A;   COLONOSCOPY WITH PROPOFOL N/A 06/11/2021   Procedure: COLONOSCOPY WITH PROPOFOL;  Surgeon: Toledo, TBenay Pike MD;  Location: [facility];  Service: Gastroenterology;  Laterality: N/A;   EP IMPLANTABLE DEVICE     EYE SURGERY Bilateral 2014   cataract   FOOT NEUROMA  SURGERY     gastric ulcer     GREEN LIGHT LASER TURP (TRANSURETHRAL RESECTION OF PROSTATE N/A 09/24/2014   Procedure: GREEN LIGHT LASER TURP (TRANSURETHRAL RESECTION OF PROSTATE;  Surgeon: Collier Flowers, MD;  Location: [facility];  Service: Urology;  Laterality: N/A;   HERNIA REPAIR     IMPLANTABLE CARDIOVERTER DEFIBRILLATOR (ICD) GENERATOR CHANGE N/A 12/03/2016   Procedure: BIV ICD GENERATOR CHANGE;  Surgeon: [name], MD;  Location: [facility];  Service: Cardiovascular;  Laterality: N/A;  ICD  generator discarded per protocall   INSERT / REPLACE / REMOVE PACEMAKER     SHOULDER ARTHROSCOPY WITH OPEN ROTATOR CUFF REPAIR Left 06/13/2015   Procedure: SHOULDER ARTHROSCOPIC debridement and decompression and repair of rotator cuff  tear;  Surgeon: Corky Mull, MD;  Location: [facility];  Service: Orthopedics;  Laterality: Left;   SHOULDER ARTHROSCOPY WITH OPEN ROTATOR CUFF REPAIR Left 01/09/2016   Procedure: SHOULDER ARTHROSCOPY, DEBRIDEMENT AND WITH OPEN ROTATOR CUFF REPAIR;  Surgeon: Corky Mull, MD;  Location: [facility];  Service: Orthopedics;  Laterality: Left;   TRIGGER FINGER RELEASE Right 05/13/2016   Procedure: RELEASE TRIGGER FINGER right ring finger;  Surgeon: Corky Mull, MD;  Location: [place];  Service: Orthopedics;  Laterality: Right;  sleep apnea   VAGOTOMY      Home Medications:  Allergies as of 04/06/2022   No Known Allergies      Medication List        Accurate as of April 06, 2022 10:50 AM. If you have any questions, ask your nurse or doctor.          amLODipine 5 MG tablet Commonly known as: NORVASC Take 10 mg by mouth daily.   apixaban 5 MG Tabs tablet Commonly known as: ELIQUIS Take by mouth every 12 (twelve) hours.   carvedilol 25 MG tablet Commonly known as: COREG Take 25 mg by mouth 2 (two) times daily.   colchicine 0.6 MG tablet Take 0.6 mg by mouth as needed.   Flovent Diskus 100 MCG/ACT Aepb Generic drug: Fluticasone Propionate (Inhal) Inhale into the lungs 2 (two) times daily.   hydrochlorothiazide 25 MG tablet Commonly known as: HYDRODIURIL Take 25 mg by mouth daily.   lisinopril 40 MG tablet Commonly known as: ZESTRIL Take 40 mg by mouth daily.   omeprazole 20 MG capsule Commonly known as: PRILOSEC Take 20 mg by mouth 2 (two) times daily.   oxymetazoline 0.05 % nasal spray Commonly known as: AFRIN Place 1 spray into both nostrils daily as needed for congestion.   potassium chloride SA 20 MEQ tablet Commonly known as: KLOR-CON M Take 20 mEq by mouth every other day.   pregabalin 150 MG capsule Commonly known as: LYRICA Take 150 mg by mouth at bedtime.   sulfamethoxazole-trimethoprim 800-160 MG tablet Commonly known as: BACTRIM  DS Take 1 tablet by mouth 2 (two) times daily for 10 days. Started by: Debroah Loop, PA-C   tamsulosin 0.4 MG Caps capsule Commonly known as: FLOMAX Take 0.4 mg by mouth daily.   traZODone 50 MG tablet Commonly known as: DESYREL Take 50 mg by mouth at bedtime.   venlafaxine XR 75 MG 24 hr capsule Commonly known as: EFFEXOR-XR Take 75 mg by mouth daily.        Allergies:  No Known Allergies  Family History: Family History  Problem Relation Age of Onset   COPD Mother    Hypertension Mother        Father  Hypertension Father    Cancer Father    Colon polyps Father    Prostate cancer Neg Hx    Kidney cancer Neg Hx    Bladder Cancer Neg Hx     Social History:   reports that he has never smoked. He has quit using smokeless tobacco.  His smokeless tobacco use included snuff. He reports current alcohol use of about 39.0 standard drinks of alcohol per week. He reports that he does not use drugs.  Physical Exam: BP 126/73   Pulse 65   Ht '5\' 9"'$  (1.753 m)   Wt 231 lb (104.8 kg)   BMI 34.11 kg/m   Constitutional:  Alert and oriented, no acute distress, nontoxic appearing HEENT: [place], AT Cardiovascular: No clubbing, cyanosis, or edema Respiratory: Normal respiratory effort, no increased work of breathing Skin: No rashes, bruises or suspicious lesions Neurologic: Grossly intact, no focal deficits, moving all 4 extremities Psychiatric: Normal mood and affect  Laboratory Data: Results for orders placed or performed in visit on 04/06/22  CULTURE, URINE COMPREHENSIVE   Specimen: Urine   UR  Result Value Ref Range   Urine Culture, Comprehensive Final report    Organism ID, Bacteria Comment   Microscopic Examination   Urine  Result Value Ref Range   WBC, UA >30 (A) 0 - 5 /hpf   RBC, Urine 11-30 (A) 0 - 2 /hpf   Epithelial Cells (non renal) 0-10 0 - 10 /hpf   Bacteria, UA Moderate (A) None seen/Few  Urinalysis, Complete  Result Value Ref Range   Specific  Gravity, UA 1.020 1.005 - 1.030   pH, UA 6.0 5.0 - 7.5   Color, UA Yellow Yellow   Appearance Ur Clear Clear   Leukocytes,UA 1+ (A) Negative   Protein,UA 2+ (A) Negative/Trace   Glucose, UA Negative Negative   Ketones, UA Negative Negative   RBC, UA 1+ (A) Negative   Bilirubin, UA Negative Negative   Urobilinogen, Ur 4.0 (H) 0.2 - 1.0 mg/dL   Nitrite, UA Negative Negative   Microscopic Examination See below:   Bladder Scan (Post Void Residual) in office  Result Value Ref Range   Scan Result 17    Assessment & Plan:   1. Dysuria UA appears grossly infected today consistent with recurrent versus persistent UTI.  Will start empiric Bactrim and send for culture for further evaluation.  Will plan to repeat a UA in 7 to 10 days to prove resolution of microscopic hematuria.  We discussed that if he continues to have hematuria, this will require further evaluation. - Urinalysis, Complete - Bladder Scan (Post Void Residual) in office - CULTURE, URINE COMPREHENSIVE - Urinalysis, Complete; Future - sulfamethoxazole-trimethoprim (BACTRIM DS) 800-160 MG tablet; Take 1 tablet by mouth 2 (two) times daily for 10 days.  Dispense: 20 tablet; Refill: 0  Return in about 1 week (around 04/13/2022) for Lab visit for UA.  Debroah Loop, PA-C  [facility] [address], [place], [place] [address] [phone]

## 2022-04-08 LAB — CULTURE, URINE COMPREHENSIVE

## 2022-04-15 ENCOUNTER — Other Ambulatory Visit: Payer: Medicare Other

## 2022-04-15 DIAGNOSIS — R300 Dysuria: Secondary | ICD-10-CM

## 2022-04-15 LAB — URINALYSIS, COMPLETE
Bilirubin, UA: NEGATIVE
Glucose, UA: NEGATIVE
Nitrite, UA: NEGATIVE
Protein,UA: NEGATIVE
Specific Gravity, UA: 1.02 (ref 1.005–1.030)
Urobilinogen, Ur: 1 mg/dL (ref 0.2–1.0)
pH, UA: 5 (ref 5.0–7.5)

## 2022-04-15 LAB — MICROSCOPIC EXAMINATION: Epithelial Cells (non renal): >10 /hpf — AB (ref 0–10)

## 2022-05-08 ENCOUNTER — Other Ambulatory Visit: Payer: Self-pay | Admitting: Physician Assistant

## 2022-05-08 ENCOUNTER — Telehealth: Payer: Self-pay | Admitting: Physician Assistant

## 2022-05-08 DIAGNOSIS — R300 Dysuria: Secondary | ICD-10-CM

## 2022-05-08 NOTE — Telephone Encounter (Signed)
Called pt to get more information, pt states he has urges to urinate, sometimes he can't make it to the toilet. Pt also states some pain but couldn't tell me what or where the pain is. Pt was very vague and stated it's the same symptoms as before?? I advised with the last UA the hematuria has resolved and the last UCX was negative.

## 2022-05-08 NOTE — Telephone Encounter (Signed)
Recommend office visit with UA, PVR.

## 2022-05-08 NOTE — Telephone Encounter (Signed)
Pt has taken all of his Bactrim, feels like infection is back.  He would like to get more Bactrim.  [facility].

## 2022-05-08 NOTE — Telephone Encounter (Signed)
Spoke with patient and advised results, per pt this has been going on for days, he " guess" he can wait until Monday.  Appt scheduled

## 2022-05-11 ENCOUNTER — Ambulatory Visit: Payer: Medicare Other | Admitting: Physician Assistant

## 2022-05-11 VITALS — BP 121/68 | HR 147 | Wt 224.0 lb

## 2022-05-11 DIAGNOSIS — N390 Urinary tract infection, site not specified: Secondary | ICD-10-CM

## 2022-05-11 DIAGNOSIS — R82998 Other abnormal findings in urine: Secondary | ICD-10-CM

## 2022-05-11 DIAGNOSIS — R3915 Urgency of urination: Secondary | ICD-10-CM

## 2022-05-11 DIAGNOSIS — Z87440 Personal history of urinary (tract) infections: Secondary | ICD-10-CM

## 2022-05-11 DIAGNOSIS — R300 Dysuria: Secondary | ICD-10-CM | POA: Diagnosis not present

## 2022-05-11 LAB — URINALYSIS, COMPLETE
Bilirubin, UA: NEGATIVE
Glucose, UA: NEGATIVE
Ketones, UA: NEGATIVE
Nitrite, UA: POSITIVE — AB
Specific Gravity, UA: 1.015 (ref 1.005–1.030)
Urobilinogen, Ur: 1 mg/dL (ref 0.2–1.0)
pH, UA: 5.5 (ref 5.0–7.5)

## 2022-05-11 LAB — MICROSCOPIC EXAMINATION: WBC, UA: >30 /hpf — AB (ref 0–5)

## 2022-05-11 LAB — BLADDER SCAN AMB NON-IMAGING: Scan Result: 0

## 2022-05-11 MED ORDER — SULFAMETHOXAZOLE-TRIMETHOPRIM 800-160 MG PO TABS: 1.0000 | ORAL_TABLET | Freq: Two times a day (BID) | ORAL | 0 refills | Status: AC

## 2022-05-11 NOTE — Progress Notes (Signed)
05/11/2022 4:43 PM   Martin Hardy 1945/09/14 [id number]  CC: Chief Complaint  Patient presents with   Urinary Urgency   HPI: Martin Suen. is a 77 y.o. male with PMH urinary retention previously managed with CIC, BPH s/p greenlight in 2016, and recurrent epididymitis who presents today for evaluation of recurrent UTI.   He has a recent history of recurrent UTI, for which I saw him in clinic about 1 month ago.  He was treated with 10 days of Bactrim for ampicillin and tetracycline resistant E. coli UTI 2 months ago and had resumed antibiotic, likely Bactrim, before seeing me in clinic last month.  His urine culture with me was negative despite a grossly positive UA.  Today he reports he feels like his urinary symptoms never fully resolved after taking his second course of antibiotics.  He describes terminal dysuria and urgency and denies fever, chills, nausea, vomiting, flank pain, and gross hematuria.  In-office UA today positive for 2+ blood, 1+ protein, nitrites, and 1+ leukocytes; urine microscopy with >30 WBCs/HPF, 11-30 RBCs/HPF, and many bacteria. PVR 23m.  PMH: Past Medical History:  Diagnosis Date   Abdominal aortic aneurysm (AAA) ([place]    AICD (automatic cardioverter/defibrillator) present    Alcohol abuse    Bladder tumor    BPH (benign prostatic hyperplasia)    Cancer ([place] 2012   Polyp resected colonoscopy   Carcinoma of rectum ([place] 2012   [place]iomy[place] ([place]    Carotid artery stenosis    CHF (congestive heart failure) ([place]    CHRONIC   Cyst of left kidney    Depression    Diverticulosis    Duodenal ulcer    Dysrhythmia    Enlarged heart    Enlarged heart    Epididymal mass    Fatty liver    Gallstones    GERD (gastroesophageal reflux disease)    Gout    History of bleeding peptic ulcer    History of blood transfusion    Hx MRSA infection    Hyperlipidemia    Hypertension    Obesity    Overweight    Presence of permanent cardiac  pacemaker    PUD (peptic ulcer disease)    Sciatica neuralgia, left    Scrotal cyst    Skin cancer    basal cell, shoulders chest and arms    Skin cancer 2013   melanoma on chest   Sleep apnea    cpap   Spermatocele    Syncope 2014   Thrombocytopenia ([place]    Urinary retention     Surgical History: Past Surgical History:  Procedure Laterality Date   CARDIAC CATHETERIZATION     CATARACT EXTRACTION, BILATERAL     CHOLECYSTECTOMY N/A 06/08/2017   Procedure: LAPAROSCOPIC CHOLECYSTECTOMY;  Surgeon: SLeonie Green MD;  Location: [facility];  Service: General;  Laterality: N/A;   COLONOSCOPY WITH PROPOFOL N/A 07/22/2016   Procedure: COLONOSCOPY WITH PROPOFOL;  Surgeon: RManya Silvas MD;  [facility];  Service: Endoscopy;  Laterality: N/A;   COLONOSCOPY WITH PROPOFOL N/A 06/11/2021   Procedure: COLONOSCOPY WITH PROPOFOL;  Surgeon: Toledo, TBenay Pike MD;  Location: [facility];  Service: Gastroenterology;  Laterality: N/A;   EP IMPLANTABLE DEVICE     EYE SURGERY Bilateral 2014   cataract   FOOT NEUROMA SURGERY     gastric ulcer     GREEN LIGHT LASER TURP (TRANSURETHRAL RESECTION OF PROSTATE N/A 09/24/2014   Procedure: GREEN LIGHT LASER TURP (  TRANSURETHRAL RESECTION OF PROSTATE;  Surgeon: Collier Flowers, MD;  Location: [facility];  Service: Urology;  Laterality: N/A;   HERNIA REPAIR     IMPLANTABLE CARDIOVERTER DEFIBRILLATOR (ICD) GENERATOR CHANGE N/A 12/03/2016   Procedure: BIV ICD GENERATOR CHANGE;  Surgeon: [name], MD;  Location: [facility];  Service: Cardiovascular;  Laterality: N/A;  ICD  generator discarded per protocall   INSERT / REPLACE / REMOVE PACEMAKER     SHOULDER ARTHROSCOPY WITH OPEN ROTATOR CUFF REPAIR Left 06/13/2015   Procedure: SHOULDER ARTHROSCOPIC debridement and decompression and repair of rotator cuff tear;  Surgeon: Corky Mull, MD;  Location: [facility];  Service: Orthopedics;  Laterality: Left;   SHOULDER ARTHROSCOPY WITH OPEN  ROTATOR CUFF REPAIR Left 01/09/2016   Procedure: SHOULDER ARTHROSCOPY, DEBRIDEMENT AND WITH OPEN ROTATOR CUFF REPAIR;  Surgeon: Corky Mull, MD;  Location: [facility];  Service: Orthopedics;  Laterality: Left;   TRIGGER FINGER RELEASE Right 05/13/2016   Procedure: RELEASE TRIGGER FINGER right ring finger;  Surgeon: Corky Mull, MD;  Location: [place];  Service: Orthopedics;  Laterality: Right;  sleep apnea   VAGOTOMY      Home Medications:  Allergies as of 05/11/2022   No Known Allergies      Medication List        Accurate as of May 11, 2022  4:43 PM. If you have any questions, ask your nurse or doctor.          amLODipine 5 MG tablet Commonly known as: NORVASC Take 10 mg by mouth daily.   apixaban 5 MG Tabs tablet Commonly known as: ELIQUIS Take by mouth every 12 (twelve) hours.   carvedilol 25 MG tablet Commonly known as: COREG Take 25 mg by mouth 2 (two) times daily.   colchicine 0.6 MG tablet Take 0.6 mg by mouth as needed.   Flovent Diskus 100 MCG/ACT Aepb Generic drug: Fluticasone Propionate (Inhal) Inhale into the lungs 2 (two) times daily.   hydrochlorothiazide 25 MG tablet Commonly known as: HYDRODIURIL Take 25 mg by mouth daily.   lisinopril 40 MG tablet Commonly known as: ZESTRIL Take 40 mg by mouth daily.   omeprazole 20 MG capsule Commonly known as: PRILOSEC Take 20 mg by mouth 2 (two) times daily.   oxymetazoline 0.05 % nasal spray Commonly known as: AFRIN Place 1 spray into both nostrils daily as needed for congestion.   potassium chloride SA 20 MEQ tablet Commonly known as: KLOR-CON M Take 20 mEq by mouth every other day.   pregabalin 150 MG capsule Commonly known as: LYRICA Take 150 mg by mouth at bedtime.   sulfamethoxazole-trimethoprim 800-160 MG tablet Commonly known as: BACTRIM DS Take 1 tablet by mouth 2 (two) times daily for 28 days.   tamsulosin 0.4 MG Caps capsule Commonly known as: FLOMAX Take 0.4 mg by  mouth daily.   traZODone 50 MG tablet Commonly known as: DESYREL Take 50 mg by mouth at bedtime.   venlafaxine XR 75 MG 24 hr capsule Commonly known as: EFFEXOR-XR Take 75 mg by mouth daily.        Allergies:  No Known Allergies  Family History: Family History  Problem Relation Age of Onset   COPD Mother    Hypertension Mother        Father   Hypertension Father    Cancer Father    Colon polyps Father    Prostate cancer Neg Hx    Kidney cancer Neg Hx    Bladder Cancer  Neg Hx     Social History:   reports that he has never smoked. He has quit using smokeless tobacco.  His smokeless tobacco use included snuff. He reports current alcohol use of about 39.0 standard drinks of alcohol per week. He reports that he does not use drugs.  Physical Exam: BP 121/68   Pulse (!) 147   Wt 224 lb (101.6 kg)   BMI 33.08 kg/m   Constitutional:  Alert and oriented, no acute distress, nontoxic appearing HEENT: [place], AT Cardiovascular: No clubbing, cyanosis, or edema Respiratory: Normal respiratory effort, no increased work of breathing Skin: No rashes, bruises or suspicious lesions Neurologic: Grossly intact, no focal deficits, moving all 4 extremities Psychiatric: Normal mood and affect  Laboratory Data: Results for orders placed or performed in visit on 05/11/22  Microscopic Examination   Urine  Result Value Ref Range   WBC, UA >30 (A) 0 - 5 /hpf   RBC, Urine 11-30 (A) 0 - 2 /hpf   Epithelial Cells (non renal) 0-10 0 - 10 /hpf   Casts Present (A) None seen /lpf   Cast Type Hyaline casts N/A   Bacteria, UA Many (A) None seen/Few  Urinalysis, Complete  Result Value Ref Range   Specific Gravity, UA 1.015 1.005 - 1.030   pH, UA 5.5 5.0 - 7.5   Color, UA Yellow Yellow   Appearance Ur Cloudy (A) Clear   Leukocytes,UA 1+ (A) Negative   Protein,UA 1+ (A) Negative/Trace   Glucose, UA Negative Negative   Ketones, UA Negative Negative   RBC, UA 2+ (A) Negative   Bilirubin, UA  Negative Negative   Urobilinogen, Ur 1.0 0.2 - 1.0 mg/dL   Nitrite, UA Positive (A) Negative   Microscopic Examination See below:   Bladder Scan (Post Void Residual) in office  Result Value Ref Range   Scan Result 0 ml    Assessment & Plan:   1. Recurrent UTI UA again appears grossly infected consistent with recurrent UTI.  I suspect chronic bacterial prostatitis as underlying, though cannot rule out underlying stone nidus.  I recommended pursuing CT stone study to rule out stone, though he is asymptomatic of this, and we will repeat urine culture for further evaluation.  Will start him on empiric Bactrim DS twice daily x 4 weeks.  He is in agreement with this plan. - Urinalysis, Complete - Bladder Scan (Post Void Residual) in office - CULTURE, URINE COMPREHENSIVE - CT RENAL STONE STUDY; Future - sulfamethoxazole-trimethoprim (BACTRIM DS) 800-160 MG tablet; Take 1 tablet by mouth 2 (two) times daily for 28 days.  Dispense: 56 tablet; Refill: 0  Return for Will call with results.  Debroah Loop, PA-C  [facility] [address], [place], [place] [address] [phone]

## 2022-05-14 LAB — CULTURE, URINE COMPREHENSIVE

## 2022-05-28 ENCOUNTER — Ambulatory Visit: Payer: Medicare Other

## 2022-07-17 ENCOUNTER — Ambulatory Visit
Admission: RE | Admit: 2022-07-17 | Discharge: 2022-07-17 | Disposition: A | Discharge status: Home / Self Care | Payer: Medicare Other | Source: Ambulatory Visit | Attending: Physician Assistant | Admitting: Physician Assistant

## 2022-07-17 ENCOUNTER — Other Ambulatory Visit: Payer: Self-pay | Admitting: Physician Assistant

## 2022-07-17 DIAGNOSIS — N390 Urinary tract infection, site not specified: Secondary | ICD-10-CM | POA: Diagnosis present

## 2022-07-17 DIAGNOSIS — R109 Unspecified abdominal pain: Secondary | ICD-10-CM | POA: Diagnosis present

## 2022-07-17 DIAGNOSIS — R1012 Left upper quadrant pain: Secondary | ICD-10-CM

## 2022-07-24 ENCOUNTER — Encounter: Payer: Self-pay | Admitting: Physician Assistant

## 2022-07-24 ENCOUNTER — Ambulatory Visit: Payer: Medicare Other | Admitting: Physician Assistant

## 2022-07-24 VITALS — BP 128/87 | HR 67 | Ht 69.0 in | Wt 223.3 lb

## 2022-07-24 DIAGNOSIS — N390 Urinary tract infection, site not specified: Secondary | ICD-10-CM

## 2022-07-24 DIAGNOSIS — Z87440 Personal history of urinary (tract) infections: Secondary | ICD-10-CM | POA: Diagnosis not present

## 2022-07-24 LAB — MICROSCOPIC EXAMINATION: Epithelial Cells (non renal): >10 /hpf — AB (ref 0–10)

## 2022-07-24 LAB — URINALYSIS, COMPLETE
Bilirubin, UA: NEGATIVE
Glucose, UA: NEGATIVE
Ketones, UA: NEGATIVE
Leukocytes,UA: NEGATIVE
Nitrite, UA: NEGATIVE
Protein,UA: NEGATIVE
Specific Gravity, UA: 1.02 (ref 1.005–1.030)
Urobilinogen, Ur: 0.2 mg/dL (ref 0.2–1.0)
pH, UA: 5.5 (ref 5.0–7.5)

## 2022-07-24 LAB — BLADDER SCAN AMB NON-IMAGING

## 2022-07-24 MED ORDER — SULFAMETHOXAZOLE-TRIMETHOPRIM 800-160 MG PO TABS: 1.0000 | ORAL_TABLET | Freq: Two times a day (BID) | ORAL | 0 refills | Status: AC

## 2022-07-24 NOTE — Progress Notes (Unsigned)
07/24/2022 10:51 AM   Martin Hardy [birth year] CR:[id number]  CC: Chief Complaint  Patient presents with   Urinary Tract Infection   HPI: Martin Hardy. is a 77 y.o. male with PMH BPH s/p greenlight in 2016 and recurrent UTI/epididymitis who presents today for follow-up of recurrent UTI.  I saw him in clinic most recently on 05/11/2022 for the same.  At that point, I put him on 4 weeks of empiric Bactrim to treat possibly underlying prostatitis.    He saw his PCP 7 days ago with reports of lower abdominal pain, weakness, malaise, and left upper quadrant and left flank pain.  CT stone study was performed, with no evidence of nephrolithiasis.  There was some stranding along the mid left ureter and retroperitoneum with circumferential bladder wall thickening and perivesical stranding concerning for ascending UTI.  He was started on Omnicef 300 mg twice daily x 10 days and urine culture grew pansensitive E. coli.  Today he reports his symptoms have completely resolved on antibiotics, and he is 3 to 4 days of his prescription remaining to take.  He denies fevers and states he is asymptomatic today.  Overall, he reports that his symptoms tend to completely resolved on antibiotics, but they tend to return within 1 week of completing them.  This also occurred with the 4 weeks of Bactrim I prescribed him earlier this year.  In-office UA today positive for trace intact blood; urine microscopy with 3-10 RBCs/HPF, >10 epithelial cells/hpf, and moderate bacteria. PVR 52mL.  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   Cardiomyopathy ([place])    Carotid artery stenosis    CHF (congestive heart failure) ([place])    CHRONIC   Cyst of left kidney     Depression    Diverticulosis    Duodenal ulcer    Dysrhythmia    Enlarged heart    Enlarged heart    Epididymal mass    Fatty liver    Gallstones    GERD (gastroesophageal reflux disease)    Gout    History of bleeding peptic ulcer    History of blood transfusion    Hx MRSA infection    Hyperlipidemia    Hypertension    Obesity    Overweight    Presence of permanent cardiac pacemaker    PUD (peptic ulcer disease)    Sciatica neuralgia, left    Scrotal cyst    Skin cancer    basal cell, shoulders chest and arms    Skin cancer 2013   melanoma on chest   Sleep apnea    cpap   Spermatocele    Syncope 2014   Thrombocytopenia ([place])    Urinary retention     Surgical History: Past Surgical History:  Procedure Laterality Date   CARDIAC CATHETERIZATION     CATARACT EXTRACTION, BILATERAL     CHOLECYSTECTOMY N/A 06/08/2017   Procedure: LAPAROSCOPIC CHOLECYSTECTOMY;  Surgeon: Leonie Green, MD;  Location: [facility] ORS;  Service: General;  Laterality: N/A;   COLONOSCOPY WITH PROPOFOL N/A 07/22/2016   Procedure: COLONOSCOPY WITH PROPOFOL;  Surgeon: Manya Silvas, MD;  Location: [facility] ENDOSCOPY;  Service: Endoscopy;  Laterality: N/A;   COLONOSCOPY WITH PROPOFOL  N/A 06/11/2021   Procedure: COLONOSCOPY WITH PROPOFOL;  Surgeon: Toledo, Benay Pike, MD;  Location: [facility] ENDOSCOPY;  Service: Gastroenterology;  Laterality: N/A;   EP IMPLANTABLE DEVICE     EYE SURGERY Bilateral 2014   cataract   FOOT NEUROMA SURGERY     gastric ulcer     GREEN LIGHT LASER TURP (TRANSURETHRAL RESECTION OF PROSTATE N/A 09/24/2014   Procedure: GREEN LIGHT LASER TURP (TRANSURETHRAL RESECTION OF PROSTATE;  Surgeon: Collier Flowers, MD;  Location: [facility] ORS;  Service: Urology;  Laterality: N/A;   HERNIA REPAIR     IMPLANTABLE CARDIOVERTER DEFIBRILLATOR (ICD) GENERATOR CHANGE N/A 12/03/2016   Procedure: BIV ICD GENERATOR CHANGE;  Surgeon: [name], MD;  Location: [facility] ORS;  Service:  Cardiovascular;  Laterality: N/A;  ICD  generator discarded per protocall   INSERT / REPLACE / REMOVE PACEMAKER     SHOULDER ARTHROSCOPY WITH OPEN ROTATOR CUFF REPAIR Left 06/13/2015   Procedure: SHOULDER ARTHROSCOPIC debridement and decompression and repair of rotator cuff tear;  Surgeon: Corky Mull, MD;  Location: [facility] ORS;  Service: Orthopedics;  Laterality: Left;   SHOULDER ARTHROSCOPY WITH OPEN ROTATOR CUFF REPAIR Left 01/09/2016   Procedure: SHOULDER ARTHROSCOPY, DEBRIDEMENT AND WITH OPEN ROTATOR CUFF REPAIR;  Surgeon: Corky Mull, MD;  Location: [facility] ORS;  Service: Orthopedics;  Laterality: Left;   TRIGGER FINGER RELEASE Right 05/13/2016   Procedure: RELEASE TRIGGER FINGER right ring finger;  Surgeon: Corky Mull, MD;  Location: [place];  Service: Orthopedics;  Laterality: Right;  sleep apnea   VAGOTOMY      Home Medications:  Allergies as of 07/24/2022   No Known Allergies      Medication List        Accurate as of July 24, 2022 10:51 AM. If you have any questions, ask your nurse or doctor.          amLODipine 5 MG tablet Commonly known as: NORVASC Take 10 mg by mouth daily.   apixaban 5 MG Tabs tablet Commonly known as: ELIQUIS Take by mouth every 12 (twelve) hours.   carvedilol 25 MG tablet Commonly known as: COREG Take 25 mg by mouth 2 (two) times daily.   cefdinir 300 MG capsule Commonly known as: OMNICEF Take by mouth.   colchicine 0.6 MG tablet Take 0.6 mg by mouth as needed.   Flovent Diskus 100 MCG/ACT Aepb Generic drug: Fluticasone Propionate (Inhal) Inhale into the lungs 2 (two) times daily.   hydrochlorothiazide 25 MG tablet Commonly known as: HYDRODIURIL Take 25 mg by mouth daily.   lisinopril 40 MG tablet Commonly known as: ZESTRIL Take 40 mg by mouth daily.   omeprazole 20 MG capsule Commonly known as: PRILOSEC Take 20 mg by mouth 2 (two) times daily.   oxymetazoline 0.05 % nasal spray Commonly known as: AFRIN Place  1 spray into both nostrils daily as needed for congestion.   potassium chloride SA 20 MEQ tablet Commonly known as: KLOR-CON M Take 20 mEq by mouth every other day.   pregabalin 150 MG capsule Commonly known as: LYRICA Take 150 mg by mouth at bedtime.   tamsulosin 0.4 MG Caps capsule Commonly known as: FLOMAX Take 0.4 mg by mouth daily.   traZODone 50 MG tablet Commonly known as: DESYREL Take 50 mg by mouth at bedtime.   venlafaxine XR 75 MG 24 hr capsule Commonly known as: EFFEXOR-XR Take 75 mg by mouth daily.        Allergies:  No Known Allergies  Family History: Family History  Problem Relation Age of Onset   COPD Mother    Hypertension Mother        Father   Hypertension Father    Cancer Father    Colon polyps Father    Prostate cancer Neg Hx    Kidney cancer Neg Hx    Bladder Cancer Neg Hx     Social History:   reports that he has never smoked. He has quit using smokeless tobacco.  His smokeless tobacco use included snuff. He reports current alcohol use of about 39.0 standard drinks of alcohol per week. He reports that he does not use drugs.  Physical Exam: BP 128/87   Pulse 67   Ht 5\' 9"  (1.753 m)   Wt 223 lb 4.8 oz (101.3 kg)   BMI 32.98 kg/m   Constitutional:  Alert and oriented, no acute distress, nontoxic appearing HEENT: [place], AT Cardiovascular: No clubbing, cyanosis, or edema Respiratory: Normal respiratory effort, no increased work of breathing Skin: No rashes, bruises or suspicious lesions Neurologic: Grossly intact, no focal deficits, moving all 4 extremities Psychiatric: Normal mood and affect  Laboratory Data: Results for orders placed or performed in visit on 07/24/22  Microscopic Examination   Urine  Result Value Ref Range   WBC, UA 0-5 0 - 5 /hpf   RBC, Urine 3-10 (A) 0 - 2 /hpf   Epithelial Cells (non renal) >10 (A) 0 - 10 /hpf   Casts Present (A) None seen /lpf   Cast Type Hyaline casts N/A   Mucus, UA Present (A) Not Estab.    Bacteria, UA Moderate (A) None seen/Few  Urinalysis, Complete  Result Value Ref Range   Specific Gravity, UA 1.020 1.005 - 1.030   pH, UA 5.5 5.0 - 7.5   Color, UA Yellow Yellow   Appearance Ur Clear Clear   Leukocytes,UA Negative Negative   Protein,UA Negative Negative/Trace   Glucose, UA Negative Negative   Ketones, UA Negative Negative   RBC, UA Trace (A) Negative   Bilirubin, UA Negative Negative   Urobilinogen, Ur 0.2 0.2 - 1.0 mg/dL   Nitrite, UA Negative Negative   Microscopic Examination See below:   Bladder Scan (Post Void Residual) in office  Result Value Ref Range   Scan Result 47ML    Pertinent Imaging: Results for orders placed during the hospital encounter of 07/17/22  CT RENAL STONE STUDY  Narrative CLINICAL DATA:  Left flank pain for weeks, UTI  EXAM: CT ABDOMEN AND PELVIS WITHOUT CONTRAST  TECHNIQUE: Multidetector CT imaging of the abdomen and pelvis was performed following the standard protocol without IV contrast.  RADIATION DOSE REDUCTION: This exam was performed according to the departmental dose-optimization program which includes automated exposure control, adjustment of the mA and/or kV according to patient size and/or use of iterative reconstruction technique.  COMPARISON:  CT 12/20/2020, CT 01/04/2020  FINDINGS: Lower chest: Pacemaker/AICD leads within the right atrium and right ventricle. No acute findings.  Hepatobiliary: Unchanged cyst in the right hepatic dome. Mild hepatic steatosis. Prior cholecystectomy.  Pancreas: No ductal dilation or peripancreatic inflammatory change. Mild atrophy.  Spleen: Normal in size without focal abnormality.  Adrenals/Urinary Tract: Adrenal glands are unremarkable. No hydronephrosis. Symmetric perinephric stranding/fluid. Tiny hemorrhagic/proteinaceous cyst in the right upper pole, and unchanged exophytic left renal cysts which require no follow-up imaging. No nephrolithiasis. There is mild focal  stranding along the mid left ureter and asymmetric left retroperitoneal stranding in the lower abdomen and pelvis. There is circumferential  bladder wall thickening with adjacent stranding. No focal fluid collection.  Stomach/Bowel: The stomach is within normal limits. There is no evidence of bowel obstruction.The appendix is normal. Extensive sigmoid diverticulosis. No convincing findings to suggest acute diverticulitis, left lower abdominal stranding is not definitely related to the sigmoid.  Vascular/Lymphatic: Aortoiliac atherosclerosis. No AAA. No lymphadenopathy.  Reproductive: Mildly enlarged prostate gland.  Other: Small fat containing inguinal hernias, left greater than right.  Musculoskeletal: No acute osseous abnormality. No suspicious osseous lesion. Degenerative changes of the spine and hips.  IMPRESSION: Circumferential bladder wall thickening with adjacent stranding, consistent with cystitis. Mild focal stranding along the mid left ureter and asymmetric left retroperitoneal stranding in the lower abdomen and pelvis suggesting ascending infection. No hydronephrosis or nephroureterolithiasis.  Extensive sigmoid diverticulosis. No convincing findings to suggest acute diverticulitis.   Electronically Signed By: Maurine Simmering M.D. On: 07/17/2022 11:50  I personally reviewed the images referenced above and note no evidence of obstructing urolithiasis.  Diffuse bladder wall thickening with evidence of ascending left-sided UTI.  Assessment & Plan:   1. Recurrent UTI Recurrent UTI, currently asymptomatic on culture appropriate Omnicef.  Unfortunately, he continues to have UTIs despite 4 weeks of Bactrim for a possible underlying chronic bacterial prostatitis earlier this year.  We discussed that sometimes it can take longer than 4 weeks of tissue penetrating antibiotics to appropriately treat an underlying prostate infection.  I continue to think this could be the  source of his problems especially given that he is repeatedly growing pansensitive E. coli.    I recommended pursuing cystoscopy and TRUS in clinic for evaluation of possible prostate abscess, however he declined this.  Instead, he would like to try an extended 6-week course of antibiotic therapy before pursuing additional testing.  Prescription sent today, patient to follow-up as needed.  May also consider prostate MRI in the future as a less invasive imaging option. - Urinalysis, Complete - Bladder Scan (Post Void Residual) in office - sulfamethoxazole-trimethoprim (BACTRIM DS) 800-160 MG tablet; Take 1 tablet by mouth 2 (two) times daily.  Dispense: 64 tablet; Refill: 0   Return if symptoms worsen or fail to improve.  Debroah Loop, PA-C  [facility] [address], [place], [place] [address] [phone]

## 2022-07-24 NOTE — Patient Instructions (Signed)
Complete the antibiotic you are currently taking and immediately start the antibiotic I've prescribed today for a total of 6 weeks of antibiotic therapy. Please come back and see me if you are not getting better or if you develop fevers.
